# Patient Record
Sex: Male | Born: 1960 | Race: Black or African American | Hispanic: No | Marital: Married | Smoking: Never smoker
Health system: Southern US, Community
[De-identification: ages and names within clinical notes are randomized; demographics above are authoritative.]

## PROBLEM LIST (undated history)

## (undated) DIAGNOSIS — M1990 Unspecified osteoarthritis, unspecified site: Secondary | ICD-10-CM

## (undated) DIAGNOSIS — G5600 Carpal tunnel syndrome, unspecified upper limb: Secondary | ICD-10-CM

## (undated) DIAGNOSIS — E669 Obesity, unspecified: Secondary | ICD-10-CM

## (undated) DIAGNOSIS — R42 Dizziness and giddiness: Secondary | ICD-10-CM

## (undated) DIAGNOSIS — F419 Anxiety disorder, unspecified: Secondary | ICD-10-CM

## (undated) DIAGNOSIS — M519 Unspecified thoracic, thoracolumbar and lumbosacral intervertebral disc disorder: Secondary | ICD-10-CM

## (undated) DIAGNOSIS — K279 Peptic ulcer, site unspecified, unspecified as acute or chronic, without hemorrhage or perforation: Secondary | ICD-10-CM

## (undated) DIAGNOSIS — E7800 Pure hypercholesterolemia, unspecified: Secondary | ICD-10-CM

## (undated) DIAGNOSIS — F329 Major depressive disorder, single episode, unspecified: Secondary | ICD-10-CM

## (undated) DIAGNOSIS — I10 Essential (primary) hypertension: Secondary | ICD-10-CM

## (undated) HISTORY — DX: Essential (primary) hypertension: I10

## (undated) HISTORY — DX: Carpal tunnel syndrome, unspecified upper limb: G56.00

## (undated) HISTORY — DX: Unspecified thoracic, thoracolumbar and lumbosacral intervertebral disc disorder: M51.9

## (undated) HISTORY — DX: Obesity, unspecified: E66.9

## (undated) HISTORY — DX: Pure hypercholesterolemia, unspecified: E78.00

## (undated) HISTORY — DX: Peptic ulcer, site unspecified, unspecified as acute or chronic, without hemorrhage or perforation: K27.9

## (undated) HISTORY — DX: Major depressive disorder, single episode, unspecified: F32.9

## (undated) HISTORY — DX: Dizziness and giddiness: R42

## (undated) HISTORY — PX: HAND SURGERY: SHX662

---

## 2004-04-12 ENCOUNTER — Emergency Department (HOSPITAL_COMMUNITY): Admission: EM | Admit: 2004-04-12 | Discharge: 2004-04-12 | Payer: Self-pay

## 2004-07-09 ENCOUNTER — Emergency Department (HOSPITAL_COMMUNITY): Admission: EM | Admit: 2004-07-09 | Discharge: 2004-07-09 | Payer: Self-pay | Admitting: Emergency Medicine

## 2004-10-09 ENCOUNTER — Ambulatory Visit: Payer: Self-pay | Admitting: Orthopedic Surgery

## 2005-02-27 ENCOUNTER — Ambulatory Visit: Payer: Self-pay | Admitting: Family Medicine

## 2005-02-28 ENCOUNTER — Ambulatory Visit (HOSPITAL_COMMUNITY): Admission: RE | Admit: 2005-02-28 | Discharge: 2005-02-28 | Payer: Self-pay | Admitting: Family Medicine

## 2005-02-28 ENCOUNTER — Ambulatory Visit: Payer: Self-pay | Admitting: *Deleted

## 2005-03-06 ENCOUNTER — Ambulatory Visit: Payer: Self-pay | Admitting: Family Medicine

## 2005-03-26 ENCOUNTER — Ambulatory Visit: Payer: Self-pay | Admitting: Family Medicine

## 2005-04-24 ENCOUNTER — Ambulatory Visit: Payer: Self-pay | Admitting: Family Medicine

## 2005-05-21 ENCOUNTER — Ambulatory Visit: Payer: Self-pay | Admitting: Internal Medicine

## 2005-05-27 ENCOUNTER — Ambulatory Visit: Payer: Self-pay | Admitting: Family Medicine

## 2005-06-02 ENCOUNTER — Ambulatory Visit: Payer: Self-pay | Admitting: Orthopedic Surgery

## 2005-06-05 ENCOUNTER — Ambulatory Visit: Payer: Self-pay | Admitting: Family Medicine

## 2005-06-19 ENCOUNTER — Ambulatory Visit: Payer: Self-pay | Admitting: Family Medicine

## 2005-09-24 ENCOUNTER — Ambulatory Visit: Payer: Self-pay | Admitting: Family Medicine

## 2005-11-25 ENCOUNTER — Ambulatory Visit: Payer: Self-pay | Admitting: Family Medicine

## 2005-11-27 ENCOUNTER — Ambulatory Visit: Payer: Self-pay | Admitting: Orthopedic Surgery

## 2005-12-23 ENCOUNTER — Ambulatory Visit: Payer: Self-pay | Admitting: Family Medicine

## 2005-12-23 LAB — CONVERTED CEMR LAB
RBC count: 5.43 10*6/uL
WBC, blood: 5.5 10*3/uL

## 2005-12-24 ENCOUNTER — Encounter (INDEPENDENT_AMBULATORY_CARE_PROVIDER_SITE_OTHER): Payer: Self-pay | Admitting: Family Medicine

## 2005-12-29 ENCOUNTER — Ambulatory Visit: Payer: Self-pay | Admitting: Family Medicine

## 2006-02-16 ENCOUNTER — Ambulatory Visit: Payer: Self-pay | Admitting: Family Medicine

## 2006-04-15 ENCOUNTER — Ambulatory Visit: Payer: Self-pay | Admitting: Orthopedic Surgery

## 2006-04-15 ENCOUNTER — Emergency Department (HOSPITAL_COMMUNITY): Admission: EM | Admit: 2006-04-15 | Discharge: 2006-04-15 | Payer: Self-pay | Admitting: Emergency Medicine

## 2006-05-18 ENCOUNTER — Ambulatory Visit: Payer: Self-pay | Admitting: Family Medicine

## 2006-06-09 ENCOUNTER — Ambulatory Visit: Payer: Self-pay | Admitting: Orthopedic Surgery

## 2006-06-29 ENCOUNTER — Ambulatory Visit: Payer: Self-pay | Admitting: Family Medicine

## 2006-07-02 ENCOUNTER — Encounter: Payer: Self-pay | Admitting: Family Medicine

## 2006-07-04 DIAGNOSIS — M1990 Unspecified osteoarthritis, unspecified site: Secondary | ICD-10-CM | POA: Insufficient documentation

## 2006-07-04 DIAGNOSIS — I10 Essential (primary) hypertension: Secondary | ICD-10-CM | POA: Insufficient documentation

## 2006-07-04 DIAGNOSIS — K219 Gastro-esophageal reflux disease without esophagitis: Secondary | ICD-10-CM

## 2006-07-04 DIAGNOSIS — K649 Unspecified hemorrhoids: Secondary | ICD-10-CM | POA: Insufficient documentation

## 2006-07-04 DIAGNOSIS — N529 Male erectile dysfunction, unspecified: Secondary | ICD-10-CM

## 2006-07-04 DIAGNOSIS — J309 Allergic rhinitis, unspecified: Secondary | ICD-10-CM | POA: Insufficient documentation

## 2006-07-04 DIAGNOSIS — E785 Hyperlipidemia, unspecified: Secondary | ICD-10-CM

## 2006-07-07 ENCOUNTER — Ambulatory Visit (HOSPITAL_COMMUNITY): Admission: RE | Admit: 2006-07-07 | Discharge: 2006-07-07 | Payer: Self-pay | Admitting: Family Medicine

## 2006-07-27 ENCOUNTER — Ambulatory Visit: Payer: Self-pay | Admitting: Family Medicine

## 2006-09-05 ENCOUNTER — Emergency Department (HOSPITAL_COMMUNITY): Admission: EM | Admit: 2006-09-05 | Discharge: 2006-09-05 | Payer: Self-pay | Admitting: Emergency Medicine

## 2006-09-30 ENCOUNTER — Telehealth (INDEPENDENT_AMBULATORY_CARE_PROVIDER_SITE_OTHER): Payer: Self-pay | Admitting: Family Medicine

## 2006-10-10 ENCOUNTER — Encounter: Payer: Self-pay | Admitting: Orthopedic Surgery

## 2006-10-19 ENCOUNTER — Emergency Department (HOSPITAL_COMMUNITY): Admission: EM | Admit: 2006-10-19 | Discharge: 2006-10-19 | Payer: Self-pay | Admitting: Emergency Medicine

## 2006-10-23 ENCOUNTER — Emergency Department (HOSPITAL_COMMUNITY): Admission: EM | Admit: 2006-10-23 | Discharge: 2006-10-23 | Payer: Self-pay | Admitting: Emergency Medicine

## 2006-10-26 ENCOUNTER — Telehealth (INDEPENDENT_AMBULATORY_CARE_PROVIDER_SITE_OTHER): Payer: Self-pay | Admitting: Family Medicine

## 2006-11-09 ENCOUNTER — Ambulatory Visit: Payer: Self-pay | Admitting: Family Medicine

## 2006-11-09 LAB — CONVERTED CEMR LAB
HDL goal, serum: 40 mg/dL
LDL Goal: 130 mg/dL

## 2006-11-11 ENCOUNTER — Encounter (INDEPENDENT_AMBULATORY_CARE_PROVIDER_SITE_OTHER): Payer: Self-pay | Admitting: Family Medicine

## 2006-11-12 LAB — CONVERTED CEMR LAB
ALT: 23 units/L (ref 0–53)
AST: 23 units/L (ref 0–37)
Alkaline Phosphatase: 75 units/L (ref 39–117)
Chloride: 105 meq/L (ref 96–112)
Creatinine, Ser: 1.02 mg/dL (ref 0.40–1.50)
Total Bilirubin: 0.5 mg/dL (ref 0.3–1.2)

## 2006-11-23 ENCOUNTER — Telehealth (INDEPENDENT_AMBULATORY_CARE_PROVIDER_SITE_OTHER): Payer: Self-pay | Admitting: *Deleted

## 2006-11-25 ENCOUNTER — Ambulatory Visit: Payer: Self-pay | Admitting: Family Medicine

## 2006-11-26 ENCOUNTER — Encounter (INDEPENDENT_AMBULATORY_CARE_PROVIDER_SITE_OTHER): Payer: Self-pay | Admitting: Family Medicine

## 2006-11-26 ENCOUNTER — Ambulatory Visit: Payer: Self-pay | Admitting: Orthopedic Surgery

## 2006-11-30 ENCOUNTER — Encounter (INDEPENDENT_AMBULATORY_CARE_PROVIDER_SITE_OTHER): Payer: Self-pay | Admitting: Family Medicine

## 2006-12-24 ENCOUNTER — Encounter (INDEPENDENT_AMBULATORY_CARE_PROVIDER_SITE_OTHER): Payer: Self-pay | Admitting: Family Medicine

## 2006-12-29 ENCOUNTER — Encounter (INDEPENDENT_AMBULATORY_CARE_PROVIDER_SITE_OTHER): Payer: Self-pay | Admitting: Family Medicine

## 2007-01-11 ENCOUNTER — Ambulatory Visit: Payer: Self-pay | Admitting: Family Medicine

## 2007-01-11 DIAGNOSIS — F341 Dysthymic disorder: Secondary | ICD-10-CM | POA: Insufficient documentation

## 2007-01-12 ENCOUNTER — Encounter (INDEPENDENT_AMBULATORY_CARE_PROVIDER_SITE_OTHER): Payer: Self-pay | Admitting: Family Medicine

## 2007-01-12 LAB — CONVERTED CEMR LAB
ALT: 21 units/L (ref 0–53)
Alkaline Phosphatase: 72 units/L (ref 39–117)
Basophils Absolute: 0 10*3/uL (ref 0.0–0.1)
Creatinine, Ser: 1.06 mg/dL (ref 0.40–1.50)
Eosinophils Absolute: 0.2 10*3/uL (ref 0.0–0.7)
Eosinophils Relative: 3 % (ref 0–5)
Glucose, Bld: 108 mg/dL — ABNORMAL HIGH (ref 70–99)
HCT: 46.8 % (ref 39.0–52.0)
MCHC: 33.5 g/dL (ref 30.0–36.0)
MCV: 87.3 fL (ref 78.0–100.0)
PSA: 0.6 ng/mL (ref 0.10–4.00)
Platelets: 215 10*3/uL (ref 150–400)
RDW: 14.3 % — ABNORMAL HIGH (ref 11.5–14.0)
Sodium: 140 meq/L (ref 135–145)
Total Bilirubin: 0.6 mg/dL (ref 0.3–1.2)
Total Protein: 7.3 g/dL (ref 6.0–8.3)

## 2007-02-16 ENCOUNTER — Telehealth (INDEPENDENT_AMBULATORY_CARE_PROVIDER_SITE_OTHER): Payer: Self-pay | Admitting: *Deleted

## 2007-02-17 ENCOUNTER — Telehealth (INDEPENDENT_AMBULATORY_CARE_PROVIDER_SITE_OTHER): Payer: Self-pay | Admitting: *Deleted

## 2007-02-17 ENCOUNTER — Ambulatory Visit: Payer: Self-pay | Admitting: Family Medicine

## 2007-02-18 ENCOUNTER — Encounter (INDEPENDENT_AMBULATORY_CARE_PROVIDER_SITE_OTHER): Payer: Self-pay | Admitting: Family Medicine

## 2007-03-08 ENCOUNTER — Ambulatory Visit: Payer: Self-pay | Admitting: Internal Medicine

## 2007-03-17 ENCOUNTER — Encounter (INDEPENDENT_AMBULATORY_CARE_PROVIDER_SITE_OTHER): Payer: Self-pay | Admitting: Family Medicine

## 2007-03-29 ENCOUNTER — Encounter (INDEPENDENT_AMBULATORY_CARE_PROVIDER_SITE_OTHER): Payer: Self-pay | Admitting: Family Medicine

## 2007-04-19 ENCOUNTER — Telehealth (INDEPENDENT_AMBULATORY_CARE_PROVIDER_SITE_OTHER): Payer: Self-pay | Admitting: Family Medicine

## 2007-04-19 ENCOUNTER — Ambulatory Visit: Payer: Self-pay | Admitting: Family Medicine

## 2007-04-20 ENCOUNTER — Encounter (INDEPENDENT_AMBULATORY_CARE_PROVIDER_SITE_OTHER): Payer: Self-pay | Admitting: Family Medicine

## 2007-04-21 LAB — CONVERTED CEMR LAB
CO2: 28 meq/L (ref 19–32)
Chloride: 99 meq/L (ref 96–112)
Creatinine, Ser: 1.01 mg/dL (ref 0.40–1.50)
Potassium: 3.3 meq/L — ABNORMAL LOW (ref 3.5–5.3)

## 2007-04-26 ENCOUNTER — Telehealth (INDEPENDENT_AMBULATORY_CARE_PROVIDER_SITE_OTHER): Payer: Self-pay | Admitting: *Deleted

## 2007-04-26 ENCOUNTER — Encounter (INDEPENDENT_AMBULATORY_CARE_PROVIDER_SITE_OTHER): Payer: Self-pay | Admitting: Family Medicine

## 2007-04-27 ENCOUNTER — Ambulatory Visit: Payer: Self-pay | Admitting: Family Medicine

## 2007-04-27 DIAGNOSIS — G5600 Carpal tunnel syndrome, unspecified upper limb: Secondary | ICD-10-CM | POA: Insufficient documentation

## 2007-04-29 ENCOUNTER — Encounter (INDEPENDENT_AMBULATORY_CARE_PROVIDER_SITE_OTHER): Payer: Self-pay | Admitting: Family Medicine

## 2007-04-29 ENCOUNTER — Ambulatory Visit: Payer: Self-pay | Admitting: Orthopedic Surgery

## 2007-05-18 ENCOUNTER — Ambulatory Visit: Payer: Self-pay | Admitting: Family Medicine

## 2007-05-24 ENCOUNTER — Encounter (INDEPENDENT_AMBULATORY_CARE_PROVIDER_SITE_OTHER): Payer: Self-pay | Admitting: Family Medicine

## 2007-05-31 ENCOUNTER — Encounter (INDEPENDENT_AMBULATORY_CARE_PROVIDER_SITE_OTHER): Payer: Self-pay | Admitting: Family Medicine

## 2007-06-01 ENCOUNTER — Telehealth (INDEPENDENT_AMBULATORY_CARE_PROVIDER_SITE_OTHER): Payer: Self-pay | Admitting: *Deleted

## 2007-06-01 ENCOUNTER — Encounter (INDEPENDENT_AMBULATORY_CARE_PROVIDER_SITE_OTHER): Payer: Self-pay | Admitting: Family Medicine

## 2007-06-01 LAB — CONVERTED CEMR LAB
AST: 19 units/L (ref 0–37)
Albumin: 4.4 g/dL (ref 3.5–5.2)
BUN: 12 mg/dL (ref 6–23)
Calcium: 9.7 mg/dL (ref 8.4–10.5)
Chloride: 99 meq/L (ref 96–112)
Cholesterol: 223 mg/dL — ABNORMAL HIGH (ref 0–200)
Creatinine, Ser: 1 mg/dL (ref 0.40–1.50)
Glucose, Bld: 116 mg/dL — ABNORMAL HIGH (ref 70–99)
HDL: 43 mg/dL (ref >39)
Total CHOL/HDL Ratio: 5.2
Triglycerides: 118 mg/dL (ref <150)

## 2007-06-02 ENCOUNTER — Encounter (INDEPENDENT_AMBULATORY_CARE_PROVIDER_SITE_OTHER): Payer: Self-pay | Admitting: Family Medicine

## 2007-06-25 ENCOUNTER — Telehealth (INDEPENDENT_AMBULATORY_CARE_PROVIDER_SITE_OTHER): Payer: Self-pay | Admitting: *Deleted

## 2007-06-25 ENCOUNTER — Ambulatory Visit: Payer: Self-pay | Admitting: Family Medicine

## 2007-06-25 DIAGNOSIS — R7309 Other abnormal glucose: Secondary | ICD-10-CM | POA: Insufficient documentation

## 2007-06-25 DIAGNOSIS — M5126 Other intervertebral disc displacement, lumbar region: Secondary | ICD-10-CM

## 2007-07-22 ENCOUNTER — Encounter: Payer: Self-pay | Admitting: Family Medicine

## 2007-07-28 ENCOUNTER — Ambulatory Visit: Payer: Self-pay | Admitting: Orthopedic Surgery

## 2007-08-06 ENCOUNTER — Ambulatory Visit: Payer: Self-pay | Admitting: Family Medicine

## 2007-08-06 ENCOUNTER — Telehealth (INDEPENDENT_AMBULATORY_CARE_PROVIDER_SITE_OTHER): Payer: Self-pay | Admitting: *Deleted

## 2007-08-19 ENCOUNTER — Telehealth (INDEPENDENT_AMBULATORY_CARE_PROVIDER_SITE_OTHER): Payer: Self-pay | Admitting: *Deleted

## 2007-08-23 ENCOUNTER — Ambulatory Visit: Payer: Self-pay | Admitting: Family Medicine

## 2007-08-24 ENCOUNTER — Encounter (INDEPENDENT_AMBULATORY_CARE_PROVIDER_SITE_OTHER): Payer: Self-pay | Admitting: Family Medicine

## 2007-09-10 ENCOUNTER — Ambulatory Visit: Payer: Self-pay | Admitting: Family Medicine

## 2007-09-10 ENCOUNTER — Telehealth (INDEPENDENT_AMBULATORY_CARE_PROVIDER_SITE_OTHER): Payer: Self-pay | Admitting: *Deleted

## 2007-09-11 LAB — CONVERTED CEMR LAB
Alkaline Phosphatase: 67 units/L (ref 39–117)
BUN: 11 mg/dL (ref 6–23)
CO2: 28 meq/L (ref 19–32)
Cholesterol: 227 mg/dL — ABNORMAL HIGH (ref 0–200)
Creatinine, Ser: 1 mg/dL (ref 0.40–1.50)
Glucose, Bld: 99 mg/dL (ref 70–99)
HDL: 37 mg/dL — ABNORMAL LOW (ref >39)
LDL Cholesterol: 159 mg/dL — ABNORMAL HIGH (ref 0–99)
Sodium: 143 meq/L (ref 135–145)
Total Bilirubin: 0.5 mg/dL (ref 0.3–1.2)
Total CHOL/HDL Ratio: 6.1
Triglycerides: 153 mg/dL — ABNORMAL HIGH (ref <150)
VLDL: 31 mg/dL (ref 0–40)

## 2007-09-14 ENCOUNTER — Telehealth (INDEPENDENT_AMBULATORY_CARE_PROVIDER_SITE_OTHER): Payer: Self-pay | Admitting: *Deleted

## 2007-10-05 ENCOUNTER — Encounter (INDEPENDENT_AMBULATORY_CARE_PROVIDER_SITE_OTHER): Payer: Self-pay | Admitting: Family Medicine

## 2007-10-07 ENCOUNTER — Encounter (INDEPENDENT_AMBULATORY_CARE_PROVIDER_SITE_OTHER): Payer: Self-pay | Admitting: Family Medicine

## 2007-10-12 ENCOUNTER — Encounter (INDEPENDENT_AMBULATORY_CARE_PROVIDER_SITE_OTHER): Payer: Self-pay | Admitting: Family Medicine

## 2007-10-13 ENCOUNTER — Telehealth (INDEPENDENT_AMBULATORY_CARE_PROVIDER_SITE_OTHER): Payer: Self-pay | Admitting: *Deleted

## 2007-10-22 ENCOUNTER — Ambulatory Visit: Payer: Self-pay | Admitting: Family Medicine

## 2007-10-23 ENCOUNTER — Encounter (INDEPENDENT_AMBULATORY_CARE_PROVIDER_SITE_OTHER): Payer: Self-pay | Admitting: Family Medicine

## 2007-10-25 LAB — CONVERTED CEMR LAB
ALT: 23 units/L (ref 0–53)
AST: 22 units/L (ref 0–37)
Albumin: 4.1 g/dL (ref 3.5–5.2)
Alkaline Phosphatase: 72 units/L (ref 39–117)
Total Bilirubin: 0.3 mg/dL (ref 0.3–1.2)

## 2007-10-26 ENCOUNTER — Telehealth (INDEPENDENT_AMBULATORY_CARE_PROVIDER_SITE_OTHER): Payer: Self-pay | Admitting: *Deleted

## 2007-10-27 ENCOUNTER — Telehealth: Payer: Self-pay | Admitting: Orthopedic Surgery

## 2007-10-27 ENCOUNTER — Telehealth (INDEPENDENT_AMBULATORY_CARE_PROVIDER_SITE_OTHER): Payer: Self-pay | Admitting: Family Medicine

## 2007-12-02 ENCOUNTER — Ambulatory Visit: Payer: Self-pay | Admitting: Orthopedic Surgery

## 2007-12-24 ENCOUNTER — Ambulatory Visit: Payer: Self-pay | Admitting: Family Medicine

## 2007-12-24 LAB — CONVERTED CEMR LAB
Bilirubin Urine: NEGATIVE
Blood in Urine, dipstick: NEGATIVE
Protein, U semiquant: NEGATIVE
Urobilinogen, UA: 0.2
WBC Urine, dipstick: NEGATIVE
pH: 5.5

## 2007-12-29 LAB — CONVERTED CEMR LAB
BUN: 10 mg/dL (ref 6–23)
CO2: 30 meq/L (ref 19–32)
Calcium: 9.6 mg/dL (ref 8.4–10.5)
Chloride: 101 meq/L (ref 96–112)
Cholesterol: 183 mg/dL (ref 0–200)
Creatinine, Ser: 0.97 mg/dL (ref 0.40–1.50)
HDL: 38 mg/dL — ABNORMAL LOW (ref >39)
Total Bilirubin: 0.6 mg/dL (ref 0.3–1.2)
Total CHOL/HDL Ratio: 4.8
VLDL: 32 mg/dL (ref 0–40)

## 2008-01-06 ENCOUNTER — Ambulatory Visit: Payer: Self-pay | Admitting: Family Medicine

## 2008-01-10 ENCOUNTER — Telehealth (INDEPENDENT_AMBULATORY_CARE_PROVIDER_SITE_OTHER): Payer: Self-pay | Admitting: Family Medicine

## 2008-02-01 ENCOUNTER — Encounter (INDEPENDENT_AMBULATORY_CARE_PROVIDER_SITE_OTHER): Payer: Self-pay | Admitting: Family Medicine

## 2008-02-04 ENCOUNTER — Encounter (INDEPENDENT_AMBULATORY_CARE_PROVIDER_SITE_OTHER): Payer: Self-pay | Admitting: Family Medicine

## 2008-02-10 ENCOUNTER — Ambulatory Visit: Payer: Self-pay | Admitting: Family Medicine

## 2008-02-14 ENCOUNTER — Telehealth: Payer: Self-pay | Admitting: Orthopedic Surgery

## 2008-03-23 ENCOUNTER — Ambulatory Visit: Payer: Self-pay | Admitting: Family Medicine

## 2008-04-06 ENCOUNTER — Encounter (INDEPENDENT_AMBULATORY_CARE_PROVIDER_SITE_OTHER): Payer: Self-pay | Admitting: Family Medicine

## 2008-04-17 ENCOUNTER — Telehealth (INDEPENDENT_AMBULATORY_CARE_PROVIDER_SITE_OTHER): Payer: Self-pay | Admitting: *Deleted

## 2008-04-18 ENCOUNTER — Ambulatory Visit: Payer: Self-pay | Admitting: Family Medicine

## 2008-04-24 ENCOUNTER — Ambulatory Visit: Payer: Self-pay | Admitting: Orthopedic Surgery

## 2008-05-18 ENCOUNTER — Ambulatory Visit: Payer: Self-pay | Admitting: Family Medicine

## 2008-05-30 ENCOUNTER — Telehealth: Payer: Self-pay | Admitting: Orthopedic Surgery

## 2008-06-02 ENCOUNTER — Encounter (INDEPENDENT_AMBULATORY_CARE_PROVIDER_SITE_OTHER): Payer: Self-pay | Admitting: Family Medicine

## 2008-06-02 ENCOUNTER — Telehealth (INDEPENDENT_AMBULATORY_CARE_PROVIDER_SITE_OTHER): Payer: Self-pay | Admitting: Family Medicine

## 2008-06-26 ENCOUNTER — Telehealth: Payer: Self-pay | Admitting: Orthopedic Surgery

## 2008-07-10 ENCOUNTER — Telehealth (INDEPENDENT_AMBULATORY_CARE_PROVIDER_SITE_OTHER): Payer: Self-pay | Admitting: *Deleted

## 2008-08-01 ENCOUNTER — Telehealth (INDEPENDENT_AMBULATORY_CARE_PROVIDER_SITE_OTHER): Payer: Self-pay | Admitting: Family Medicine

## 2008-08-15 ENCOUNTER — Encounter (INDEPENDENT_AMBULATORY_CARE_PROVIDER_SITE_OTHER): Payer: Self-pay | Admitting: Family Medicine

## 2008-08-17 ENCOUNTER — Ambulatory Visit: Payer: Self-pay | Admitting: Family Medicine

## 2008-08-18 ENCOUNTER — Encounter (INDEPENDENT_AMBULATORY_CARE_PROVIDER_SITE_OTHER): Payer: Self-pay | Admitting: Family Medicine

## 2008-08-23 ENCOUNTER — Encounter (INDEPENDENT_AMBULATORY_CARE_PROVIDER_SITE_OTHER): Payer: Self-pay | Admitting: *Deleted

## 2008-08-23 LAB — CONVERTED CEMR LAB
ALT: 14 units/L (ref 0–53)
AST: 18 units/L (ref 0–37)
Alkaline Phosphatase: 64 units/L (ref 39–117)
Basophils Absolute: 0.1 10*3/uL (ref 0.0–0.1)
Basophils Relative: 1 % (ref 0–1)
Eosinophils Absolute: 0.3 10*3/uL (ref 0.0–0.7)
Eosinophils Relative: 6 % — ABNORMAL HIGH (ref 0–5)
Glucose, Bld: 95 mg/dL (ref 70–99)
HCT: 45.7 % (ref 39.0–52.0)
MCHC: 32.4 g/dL (ref 30.0–36.0)
MCV: 85.3 fL (ref 78.0–100.0)
Neutrophils Relative %: 46 % (ref 43–77)
PSA: 0.49 ng/mL (ref 0.10–4.00)
Platelets: 205 10*3/uL (ref 150–400)
Potassium: 3.7 meq/L (ref 3.5–5.3)
RDW: 13.8 % (ref 11.5–15.5)
Sodium: 140 meq/L (ref 135–145)
Total Bilirubin: 0.6 mg/dL (ref 0.3–1.2)
Total Protein: 7 g/dL (ref 6.0–8.3)

## 2008-09-19 ENCOUNTER — Telehealth: Payer: Self-pay | Admitting: Orthopedic Surgery

## 2008-09-25 ENCOUNTER — Ambulatory Visit: Payer: Self-pay | Admitting: Family Medicine

## 2008-10-04 ENCOUNTER — Telehealth: Payer: Self-pay | Admitting: Orthopedic Surgery

## 2008-11-29 ENCOUNTER — Ambulatory Visit: Payer: Self-pay | Admitting: Family Medicine

## 2008-11-29 DIAGNOSIS — R0989 Other specified symptoms and signs involving the circulatory and respiratory systems: Secondary | ICD-10-CM

## 2008-11-29 DIAGNOSIS — R0609 Other forms of dyspnea: Secondary | ICD-10-CM | POA: Insufficient documentation

## 2008-11-30 ENCOUNTER — Encounter (INDEPENDENT_AMBULATORY_CARE_PROVIDER_SITE_OTHER): Payer: Self-pay | Admitting: Family Medicine

## 2008-12-01 ENCOUNTER — Ambulatory Visit: Payer: Self-pay | Admitting: Cardiology

## 2008-12-01 ENCOUNTER — Encounter (INDEPENDENT_AMBULATORY_CARE_PROVIDER_SITE_OTHER): Payer: Self-pay | Admitting: Family Medicine

## 2008-12-01 ENCOUNTER — Ambulatory Visit (HOSPITAL_COMMUNITY): Admission: RE | Admit: 2008-12-01 | Discharge: 2008-12-01 | Payer: Self-pay | Admitting: Family Medicine

## 2008-12-05 ENCOUNTER — Encounter (INDEPENDENT_AMBULATORY_CARE_PROVIDER_SITE_OTHER): Payer: Self-pay | Admitting: Family Medicine

## 2008-12-20 ENCOUNTER — Telehealth (INDEPENDENT_AMBULATORY_CARE_PROVIDER_SITE_OTHER): Payer: Self-pay | Admitting: *Deleted

## 2009-01-31 ENCOUNTER — Ambulatory Visit: Payer: Self-pay | Admitting: Family Medicine

## 2009-02-13 ENCOUNTER — Encounter (INDEPENDENT_AMBULATORY_CARE_PROVIDER_SITE_OTHER): Payer: Self-pay | Admitting: Family Medicine

## 2009-02-14 LAB — CONVERTED CEMR LAB
ALT: 12 units/L (ref 0–53)
CO2: 28 meq/L (ref 19–32)
Calcium: 9.4 mg/dL (ref 8.4–10.5)
Chloride: 104 meq/L (ref 96–112)
Cholesterol: 174 mg/dL (ref 0–200)
Creatinine, Ser: 0.98 mg/dL (ref 0.40–1.50)
Glucose, Bld: 91 mg/dL (ref 70–99)
Total CHOL/HDL Ratio: 4.5
Triglycerides: 133 mg/dL (ref <150)

## 2009-03-07 ENCOUNTER — Encounter (INDEPENDENT_AMBULATORY_CARE_PROVIDER_SITE_OTHER): Payer: Self-pay | Admitting: *Deleted

## 2009-03-08 ENCOUNTER — Ambulatory Visit: Payer: Self-pay | Admitting: Family Medicine

## 2009-03-16 ENCOUNTER — Encounter (INDEPENDENT_AMBULATORY_CARE_PROVIDER_SITE_OTHER): Payer: Self-pay | Admitting: Family Medicine

## 2009-04-02 ENCOUNTER — Ambulatory Visit: Payer: Self-pay | Admitting: Family Medicine

## 2009-04-02 DIAGNOSIS — J069 Acute upper respiratory infection, unspecified: Secondary | ICD-10-CM | POA: Insufficient documentation

## 2009-09-18 ENCOUNTER — Ambulatory Visit: Payer: Self-pay | Admitting: Orthopedic Surgery

## 2010-02-20 ENCOUNTER — Ambulatory Visit: Payer: Self-pay | Admitting: Orthopedic Surgery

## 2010-02-20 DIAGNOSIS — M1710 Unilateral primary osteoarthritis, unspecified knee: Secondary | ICD-10-CM | POA: Insufficient documentation

## 2010-02-21 ENCOUNTER — Encounter (INDEPENDENT_AMBULATORY_CARE_PROVIDER_SITE_OTHER): Payer: Self-pay | Admitting: *Deleted

## 2010-03-26 ENCOUNTER — Telehealth: Payer: Self-pay | Admitting: Orthopedic Surgery

## 2010-05-14 ENCOUNTER — Emergency Department (HOSPITAL_COMMUNITY)
Admission: EM | Admit: 2010-05-14 | Discharge: 2010-05-15 | Payer: Self-pay | Source: Home / Self Care | Admitting: Emergency Medicine

## 2010-06-05 ENCOUNTER — Telehealth: Payer: Self-pay | Admitting: Orthopedic Surgery

## 2010-07-12 ENCOUNTER — Emergency Department (HOSPITAL_COMMUNITY)
Admission: EM | Admit: 2010-07-12 | Discharge: 2010-07-12 | Payer: Self-pay | Source: Home / Self Care | Admitting: Emergency Medicine

## 2010-08-11 ENCOUNTER — Encounter: Payer: Self-pay | Admitting: Family Medicine

## 2010-08-20 NOTE — Letter (Signed)
Summary: Historic Patient File  Historic Patient File   Imported By: Lind Guest 04/22/2010 08:36:17  _____________________________________________________________________  External Attachment:    Type:   Image     Comment:   External Document

## 2010-08-20 NOTE — Miscellaneous (Signed)
Summary: Pre-auth information for in-patient surgery  Clinical Lists Changes  Call to insurer [employer] [facility], regarding In-patient surgery scheduled at [facility] 04/08/10, CPT [id number]; spoke w/Stephanie,intake specialist.  Pre-Auth Ref # [id number]. If clinicals are needed, we will be notified by nurse case manager.

## 2010-08-20 NOTE — Letter (Signed)
Summary: surgery order LT total knee  surgery order LT total knee   Imported By: Cammie Sickle 02/21/2010 17:54:11  _____________________________________________________________________  External Attachment:    Type:   Image     Comment:   External Document

## 2010-08-20 NOTE — Progress Notes (Signed)
Summary: needs to hold on Total knee surgery  Phone Note Call from Patient   Caller: Patient Summary of Call: Patient called to relay that he needs to postpone his total knee surgery due to wife's new job and no one to be with him; states may need to postpone till first of new year.  Please call patient, has questions Home ph is CHANGED to [phone]; alternate ph is [phone] Initial call taken by: Cammie Sickle,  March 26, 2010 2:46 PM  Follow-up for Phone Call        ok I will cx Follow-up by: Ether Griffins,  March 26, 2010 3:04 PM

## 2010-08-20 NOTE — Progress Notes (Signed)
Summary: norco 5 filled instead of 10's  Phone Note Call from Patient Call back at [facility] Phone [phone]   Summary of Call: wants norco 10 refilled, he cx his total knee surgery, ok to fill or not Initial call taken by: Ether Griffins,  June 05, 2010 3:33 PM  Follow-up for Phone Call        no 10  \par 5 mg ok  Follow-up by: Fuller Canada MD,  June 05, 2010 3:43 PM    Prescriptions: NORCO 5-325 MG TABS (HYDROCODONE-ACETAMINOPHEN) 1 q 6 as needed pain  #84 x 1   Entered by:   Ether Griffins   Authorized by:   Fuller Canada MD   Signed by:   Ether Griffins on 06/05/2010   Method used:   Historical   RxID:   [id number]

## 2010-08-20 NOTE — Assessment & Plan Note (Signed)
Summary: LT KNEE PAIN/NEEDS XRAY/SEC HORIZ(?TRADIT MEDICARE)/CAF   Visit Type:  Follow-up Referring Provider:  self Primary Provider:  Dr. Sudie Bailey  CC:  left knee pain.  History of Present Illness: This is a 50 year old male who I have followed for several years now for LEFT knee pain.  He's having increasing pain continued stiffness with catching and locking and some swelling in the LEFT knee.  We have advised weight loss exercise, anti-inflammatories and analgesics for pain including hydrocodone which has not been able to control his pain  His radiographs show significant arthritis of the knee.  He is limited options at this point and has been advised to have a knee replacement vs. continued nonoperative treatment.  We do not think arthroscopy would help  He has agreed to total knee replacement  He understands that at his age of 26 he will probably need at least one revision.  He understands the risk of this procedure which include but are not limited to bleeding, infection, pulmonary embolus, death, blood clot, infection which would require revision which cannulate the amputation if the infection cannot be cleared.  He understands that he will need to stay in the rehabilitation process or risk having a stiff knee.  He understands that he should expect 90-95% pain relief.   Meds: Citalopram, Omeprazole, Fexofenadine, Diovan/HCTZ, Norco 5 for pain helps some, Lasix, Pro air inhaler, Pravastatin, Neurontin, Potassium, Nasal spray,   Allergies: No Known Drug Allergies  Past History:  Past Medical History: Last updated: 12/24/2007 Current Problems:  EPIGASTRIC PAIN (ICD-789.06) JOINT EFFUSION, KNEE (ICD-719.06) KNEE PAIN (ICD-719.46) SCIATICA (ICD-724.3) BACK PAIN (ICD-724.5) FASTING HYPERGLYCEMIA (ICD-790.29) DEGENERATIVE DISC DISEASE, LUMBOSACRAL SPINE W/RADICULOPATHY (ICD-722.10) CARPAL TUNNEL SYNDROME, BILATERAL (ICD-354.0) MALAISE AND FATIGUE  (ICD-780.79) DEPRESSION/ANXIETY (ICD-300.4) HEMORRHOIDS (ICD-455.6) MORBID OBESITY (ICD-278.01) IMPOTENCE, 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) OSTEOARTHRITIS (ICD-715.90) HYPERTENSION (ICD-401.9) HYPERLIPIDEMIA (ICD-272.4) GERD (ICD-530.81) ALLERGIC RHINITIS (ICD-477.9)  Past Surgical History: Last updated: 11/09/2006 SURGERY ON LEFT HAND DUE TO TRAUMA  Family History: Last updated: Oct 01, 2009 Father: died from pneumonia 96 Mom: 31 DM and Nerves, Alzheimers 3 sisters: anxiety; ulcers; HTN; cysts- 75, 48 and 45 3 brothers: 2 deceased MVA - ages 58 for both; 1 A&W  - 49 GERD and HTN 4 total - boys times 2  age 24 Hx of HTN and 20  allergies - and 2 girls age 30 and 92 - healthy Family History of Diabetes Family History of Arthritis  Social History: Last updated: 11/29/2008 Married Never Smoked Alcohol use-no Drug use-no 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  Physical Exam  Additional Exam:  GEN: well developed, well nourished, normal grooming and hygiene, no deformity and endomorphicl body habitus.   CDV: pulses are normal, no edema, no erythema. no tenderness  Lymph: normal lymph nodes   Skin: no rashes, skin lesions or open sores   NEURO: normal coordination, reflexes, sensation.   Psyche: awake, alert and oriented. Mood normal   Gait: heel-to-toe gait with decreased stride length and decreased feet of gait  His upper extremities show no contracture subluxation atrophy or tremor  His RIGHT knee is in varus.  Mild medial joint line tenderness.  Flexion 120.  Motor exam normal.  Knee stable.  LEFT knee severe medial joint line  tenderness, varus alignment  His  flexion arc is 120.  He has a slight flexion contracture.  His motor exam is normal  His knee is stable.       Impression & Recommendations:  Problem # 1:  KNEE, ARTHRITIS, DEGEN./OSTEO (ICD-715.96) Assessment Deteriorated the most recent x-rays show that he has bone to bone changes on the medial side with mild to moderate varus deformity and mild to moderate osteophytes  We have recommended a LEFT total knee arthroplasty.  Medications Added to Medication List This Visit: 1)  Norco 10-325 Mg Tabs (Hydrocodone-acetaminophen) .Marland Kitchen.. 1 by mouth q 4 as needed pain  Other Orders: Est. Patient Level IV (78242)  Patient Instructions: 1)  Left Total Knee replacement  2)  Informed consent process: I have discussed the procedure with the patient. I have answered their questions. The risks of bleeding, infection, nerve and vascualr injury have been discussed. The diagnosis and reason for surgery have been explained. The patient demonstrates understanding of this discussion. Specific to this procedure risks include:  3)  Stiffness 4)  Pain 5)  Infection-can lead to further surgery and amputation 6)  DOS 04/08/10 7)  preop 04/04/10 11:00am take packet to [facility] [facility] stay [facility] 8)  Post op 1 October 4th in our office Prescriptions: NORCO 10-325 MG TABS (HYDROCODONE-ACETAMINOPHEN) 1 by mouth q 4 as needed pain  #84 x 2   Entered and Authorized by:   Fuller Canada MD   Signed by:   Fuller Canada MD on 02/20/2010   Method used:   Print then Give to Patient   RxID:   [id number]

## 2010-08-20 NOTE — Letter (Signed)
Summary: History form  History form   Imported By: Jacklynn Ganong 09/24/2009 10:38:25  _____________________________________________________________________  External Attachment:    Type:   Image     Comment:   External Document

## 2010-08-20 NOTE — Assessment & Plan Note (Signed)
Summary: rt hand pain  needs xr/medicare/bsf   Vital Signs:  Patient profile:   50 year old male Pulse rate:   82 / minute Resp:     16 per minute  Vitals Entered By: Fuller Canada MD (September 18, 2009 3:02 PM)  Visit Type:  new problem Referring Provider:  self Primary Provider:  Dr. Sudie Bailey  CC:  right hand pain.  History of Present Illness: 50 year old male with pain in his RIGHT hand for 2 weeks no injury complaints of numbness in his fingers and has a history of locking in his RIGHT thumb with decreasing grip strength.  He's had I nerve conduction study with carpal tunnel syndrome diagnosed.  He had a brace he lost it.  He is relieved with heat worse with cold says his pain is 9/10    Will have xrays today in our office.  Meds: Nexium, Proventil, Diovan HCT, Fexofenadine, Flonase, Celexa, Pravachol.      Problems Prior to Update: 1)  Family History of Arthritis  (ICD-V17.7) 2)  Family History of Diabetes  (ICD-V18.0) 3)  ? 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  Past Medical History: Reviewed history from 12/24/2007 and no changes required. 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 SURGERY ON LEFT HAND DUE TO TRAUMA  Family History: Reviewed history from 11/29/2008 and no changes required. Father: died from pneumonia 58 Mom: 54 DM and Nerves, Alzheimers 3 sisters: anxiety; ulcers; HTN; cysts- 4, 48 and 45 3 brothers: 2 deceased MVA - ages 61 for both; 1 A&W  - 65 GERD and HTN 4 total - boys times 2  age 7 Hx of HTN and 20  allergies - and 2 girls age 52 and 18 - healthy Family History of Diabetes Family History of Arthritis  Social History: Reviewed history from 11/29/2008 and no changes required. 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 Respiratory:  Complains of short of breath; denies difficulty breathing, COPD, cough, and pneumonia; wheezing. Gastrointestinal:  Denies nausea, vomiting, diarrhea, constipation, difficulty swallowing, ulcers, GERD, and reflux. Genitourinary:  Denies kidney failure, kidney transplant, kidney stones, burning, poor stream, testicular cancer, blood in urine, and . Neurologic:  Denies headache, dizziness,  migraines, numbness, weakness, tremor, and unsteady  walking. Musculoskeletal:  Complains of joint pain and joint swelling; denies rheumatoid arthritis, gout, bone cancer, osteoporosis, and ; stiffness and muscle pain. Endocrine:  Denies thyroid disease, goiter, and diabetes. Psychiatric:  Complains of depression; denies mood swings, anxiety, panic attack, bipolar, and schizophrenia. Skin:  Denies eczema, cancer, and itching. HEENT:  Denies poor vision, cataracts, glaucoma, poor hearing, vertigo, ears ringing, sinusitis, hoarseness, toothaches, and bleeding gums; redness and watering of eyes. Immunology:  Complains of seasonal allergies; denies sinus problems and allergic to bee stings. Hemoatologic:  Denies lymph node cancer and lymph edema.  The review of systems is negative for Constitutional.  Physical Exam  Additional Exam:   VS reviewed and were normal  GEN: appearance was normal he is a heavyset male  CDV: normal pulses temperature and no edema  LYMPH nodes were normal   SKIN was normal   Neuro: normal reflexes I could not detect sensory loss but he had tenderness over his carpal tunnel. Psyche: AAO x 3 and mood was normal   MSK *Gait was normal  inspection RIGHT hand and wrist shows tenderness over carpal tunnel, decreased range of motion in the small joints of the hand, decreased grip strength RIGHT compared to LEFT, wrist joint stable      Impression & Recommendations:  Problem # 1:  CARPAL TUNNEL SYNDROME (ICD-354.0) Assessment New  100 mg Neurontin 2 tablets at night #90  Wrist splint  Follow up as needed  Orders: Est. Patient Level IV (16109)  Medications Added to Medication List This Visit: 1)  Norco 5-325 Mg Tabs (Hydrocodone-acetaminophen) .Marland Kitchen.. 1 q 6 as needed pain 2)  Neurontin 100 Mg Caps (Gabapentin) .... 2 tablets every night Prescriptions: NEURONTIN 100 MG CAPS (GABAPENTIN) 2 tablets every night  #90 x 0   Entered and Authorized by:   Fuller Canada MD   Signed by:   Fuller Canada MD on  09/19/2009   Method used:   Handwritten   RxID:   [id number] NORCO 5-325 MG TABS (HYDROCODONE-ACETAMINOPHEN) 1 q 6 as needed pain  #60 x 2   Entered and Authorized by:   Fuller Canada MD   Signed by:   Fuller Canada MD on 09/18/2009   Method used:   Print then Give to Patient   RxID:   [id number]

## 2010-10-17 ENCOUNTER — Other Ambulatory Visit: Payer: Self-pay | Admitting: *Deleted

## 2010-10-17 DIAGNOSIS — M25569 Pain in unspecified knee: Secondary | ICD-10-CM

## 2010-10-17 MED ORDER — HYDROCODONE-ACETAMINOPHEN 5-325 MG PO TABS: 1.0000 | ORAL_TABLET | Freq: Four times a day (QID) | ORAL | Status: AC | PRN

## 2010-11-26 ENCOUNTER — Encounter: Payer: Self-pay | Admitting: Orthopedic Surgery

## 2010-11-26 ENCOUNTER — Ambulatory Visit: Payer: Self-pay | Admitting: Orthopedic Surgery

## 2010-12-02 ENCOUNTER — Emergency Department (HOSPITAL_COMMUNITY)
Admission: EM | Admit: 2010-12-02 | Discharge: 2010-12-02 | Disposition: A | Discharge status: Home / Self Care | Payer: MEDICARE | Attending: Emergency Medicine | Admitting: Emergency Medicine

## 2010-12-02 ENCOUNTER — Emergency Department (HOSPITAL_COMMUNITY): Payer: MEDICARE

## 2010-12-02 DIAGNOSIS — J3489 Other specified disorders of nose and nasal sinuses: Secondary | ICD-10-CM | POA: Insufficient documentation

## 2010-12-02 DIAGNOSIS — H9209 Otalgia, unspecified ear: Secondary | ICD-10-CM | POA: Insufficient documentation

## 2010-12-02 DIAGNOSIS — R059 Cough, unspecified: Secondary | ICD-10-CM | POA: Insufficient documentation

## 2010-12-02 DIAGNOSIS — J069 Acute upper respiratory infection, unspecified: Secondary | ICD-10-CM | POA: Insufficient documentation

## 2010-12-02 DIAGNOSIS — R070 Pain in throat: Secondary | ICD-10-CM | POA: Insufficient documentation

## 2010-12-02 DIAGNOSIS — R05 Cough: Secondary | ICD-10-CM | POA: Insufficient documentation

## 2010-12-02 DIAGNOSIS — F329 Major depressive disorder, single episode, unspecified: Secondary | ICD-10-CM | POA: Insufficient documentation

## 2010-12-02 DIAGNOSIS — Z9889 Other specified postprocedural states: Secondary | ICD-10-CM | POA: Insufficient documentation

## 2010-12-02 DIAGNOSIS — Z79899 Other long term (current) drug therapy: Secondary | ICD-10-CM | POA: Insufficient documentation

## 2010-12-02 DIAGNOSIS — F3289 Other specified depressive episodes: Secondary | ICD-10-CM | POA: Insufficient documentation

## 2010-12-02 DIAGNOSIS — I10 Essential (primary) hypertension: Secondary | ICD-10-CM | POA: Insufficient documentation

## 2010-12-03 NOTE — Consult Note (Signed)
NAME:  Parker Rodriguez, Parker Rodriguez              ACCOUNT NO.:  [id number]   MEDICAL RECORD NO.:  [id number]          PATIENT TYPE:  AMB   LOCATION:  DAY                           FACILITY:  [facility]   PHYSICIAN:  R. Roetta Sessions, M.D. DATE OF BIRTH:  Oct 03, 1960   DATE OF CONSULTATION:  DATE OF DISCHARGE:                                 CONSULTATION   REASON FOR CONSULTATION:  Refractory heartburn and  indigestion/hematochezia.   HISTORY OF PRESENT ILLNESS:  Parker Rodriguez is a 50 year old male who I  actually saw back in November 2006.  He has a history of refractory GERD  as well as intermittent hematochezia felt to be due to hemorrhoids.  He  was scheduled for colonoscopy and EGD however, he tells me he chickened  out.  He has been having a significant amount of gas and bloating as  well as postprandial nausea and increased belching.  He has heartburn  and indigestion despite being on Nexium 40 mg daily at least three times  a week.  He denies any dysphagia or odynophagia.  He does have  occasional regurgitation.  He continues to notice small to moderate  amounts of bright red blood on the toilet paper with wiping, denies any  problems with clots.  He feels this is related to hemorrhoids.  He  denies any abdominal pain.  He generally has a bowel movement once or  twice a day.  Denies any constipation or diarrhea.   PAST MEDICAL HISTORY:  Hypertension, left knee pain, heart murmur,  chronic back pain, depression, left hand surgery twice.   CURRENT MEDICATION:  1. Hydrochlorothiazide 12.5 mg daily.  2. Diovan 160 mg daily.  3. Nexium 40 mg daily.  4. Fexofenadine 180 mg daily.  5. Naproxen 500 mg b.i.d.  6. Advil 200-400 mg p.r.n.  7. Lortab p.r.n.  8. Nasal spray p.r.n.  9. Albuterol p.r.n.   ALLERGIES:  No known drug allergies.   FAMILY HISTORY:  He has a sister with stomach problems. No known  family history of colorectal carcinoma or other chronic GI problems.   SOCIAL HISTORY:  Mr.  Rodriguez is married.  He is disabled.  He denies any  tobacco or  drug use.  He occasionally consumes a glass of wine.   REVIEW OF SYSTEMS:  See HPI, otherwise negative.   PHYSICAL EXAMINATION:  VITAL SIGNS:  Weight 275 pounds, height 72  inches, temperature 97.6, blood pressure 110/80, pulse 56.  GENERAL:  Parker Rodriguez is a 50 year old, obese, African-American male who  is alert, oriented, pleasant and cooperative in no acute distress.  HEENT:  Sclera clear, nonicteric.  Conjunctiva pink.  Oropharynx pink  and moist without any lesions.  NECK:  Supple without thyromegaly.  CHEST:  Heart regular rate and rhythm, normal S1, S2 with a 2/6 murmur  noted.  LUNGS:  Clear to auscultation bilaterally.  ABDOMEN:  Positive bowel sounds x4.  No bruits auscultated.  Soft,  nontender, nondistended without palpable mass or hepatosplenomegaly. No  __________  or guarding.  Exam is limited given the patient's body  habitus.  EXTREMITIES:  Without clubbing  or edema bilaterally.  SKIN:  Warm and dry without any rash or jaundice.   IMPRESSION:  Parker Rodriguez is a 50 year old African-American male with a  history of refractory gastroesophageal reflux disease symptoms despite  daily Nexium 40 mg.  He complains of postprandial nausea, gas, bloating  and belching along with occasional regurgitation.  I suspect he may have  gastritis or esophagitis.  He is going to need further evaluation.   He also has intermittent hematochezia which could be related to benign  anorectal source, however, given his at age we need to proceed with  colonoscopy to rule out colorectal carcinoma or polyps.   PLAN:  1. Esophagogastroduodenoscopy and colonoscopy with Dr. Jena Gauss in the      near future. I discussed the procedure including the risks and      benefits including but not limited to bleeding, infection,      perforation, drug reaction, if he agrees informed consent will be      obtained.  2. He is to continue Nexium 40  mg daily for now.  3. Further recommendations pending procedure.   We would like thank Dr. Erby Pian for allowing [place] to participate in the  care of Parker Rodriguez.      Lorenza Burton, N.P.      Jonathon Bellows, M.D.  Electronically Signed    KJ/MEDQ  D:  03/08/2007  T:  03/09/2007  Job:  [id number]

## 2010-12-06 NOTE — Consult Note (Signed)
NAMEALFONZIA, WOOLUM              ACCOUNT NO.:  [id number]   MEDICAL RECORD NO.:  [id number]          PATIENT TYPE:  AMB   LOCATION:                                FACILITY:  [facility]   PHYSICIAN:  Lionel December, M.D.    DATE OF BIRTH:  11/05/1960   DATE OF CONSULTATION:  05/22/2005  DATE OF DISCHARGE:                                   CONSULTATION   REFERRING PHYSICIAN:  Dorthula Rue. Early Chars, M.D.   REASON FOR CONSULTATION:  Refractory GERD symptoms, intermittent  hematochezia.   HISTORY OF PRESENT ILLNESS:  Mr. Camilli is a 50 year old, African-American  male who has had a 31-month history of heartburn, indigestion and increased  belching.  He also notes some epigastric pain, nausea, bloating without  vomiting.  He complains of his liquids and solids both feeling like they  get stuck retrosternally.  He has occasional regurgitation as well.  He  was started on Nexium 40 mg daily 2 months ago.  This provided minimal  relief and this was increased to b.i.d. 2 weeks ago which has provided 70%  relief of his symptoms, although he does continue to have pretty much daily  breakthrough symptoms.  He denies any odynophagia.  He does take 200 mg of  Advil daily as well as 1-2 Naproxen.  He also notes toilet tissue  hematochezia with small volume on at least three occasions.  He generally  has a soft, brown bowel movement every day or every other day.  He denies  any melena.  He does have history of hemorrhoids and feels he may have some  today.   PAST MEDICAL HISTORY:  1.  Hypertension.  2.  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  He is followed by Dr.      Early Chars for this.  3.  GERD.  4.  Left hand surgery twice.   CURRENT MEDICATIONS:  1.  Hydrochlorothiazide 2.5 mg daily.  2.  Diovan 160 mg daily.  3.  Lipitor 20 mg daily.  4.  Nexium 40 mg b.i.d.  5.  Fexofenadine 180 mg daily.  6.  Naproxen 500 mg b.i.d.  7.  Mobic 7.5 mg daily.  8.  Advil 200 mg one to two p.r.n. daily.  9.  Over-the-counter Gas-X p.r.n.   ALLERGIES:  No known drug allergies.   FAMILY HISTORY:  Positive for two paternal uncles with colon cancer  diagnosed at age 79.  Mother, age 4, has history of diabetes mellitus.  He  has two children with hypertension.  His father deceased with pneumonia at  age 58.  Two brothers deceased secondary to accidents.  He has three sisters  alive and one brother alive and healthy.   SOCIAL HISTORY:  Mr. Ballweg has been married for 20 years.  He has four  children.  He is currently disabled.  He denies any tobacco or drug use.  He  does consume occasional glass of wine.   REVIEW OF SYSTEMS:  CONSTITUTIONAL:  Weight is steadily decreasing  intentionally.  He denies any anorexia.  He denies any early satiety.  Denies any fevers or chills.  CARDIOVASCULAR:  He has had a cardiac  evaluation recently which has been normal per his report.  PULMONARY:  He  denies any shortness of breath, dyspnea, cough or hemoptysis.  GASTROINTESTINAL:  See HPI.   PHYSICAL EXAMINATION:  VITAL SIGNS:  Weight 269 pounds, height 72 inches,  temperature 98.1, blood pressure 118/72, pulse 64.  GENERAL:  Mr. Gladden is a 50 year old, well-developed, obese, African-  American male who is alert, oriented, pleasant and cooperative.  HEENT:  Sclerae clear, nonicteric.  Conjunctivae pink.  Oropharynx moist  without any lesions.  NECK:  Supple without any masses or thyromegaly.  HEART:  Regular rate and rhythm, normal S1, S2 without any murmurs, rubs or  gallops.  LUNGS:  Clear to auscultation bilaterally.  ABDOMEN:  Positive bowel sounds x4, no bruits auscultated.  Soft, nontender,  nondistended without palpable mass or hepatosplenomegaly.  No rebound  tenderness or guarding.  RECTAL:  He did have large, external, protruding hemorrhoids on exam which  are quite tender.  Internal exam was unable to be completed due to  significant tenderness.  EXTREMITIES:  Without edema  bilaterally.   IMPRESSION:  Mr. Lesser is a 50 year old, African-American male with 3-  month history of severe heartburn, epigastric pain, indigestion, nausea and  belching.  He has responded minimally to once daily proton pump inhibitor  therapy responding to about 70% twice daily proton pump inhibitor therapy.  We are dealing with refractory gastroesophageal reflux disease.  I feel he  should have evaluation of his upper gastrointestinal tract to rule out  complications of refractory gastroesophageal reflux disease.  He may have  erosive reflux esophagitis as he also has some dysphagia to both solids and  liquids.   On exam today, he has external hemorrhoids and will treat him for this.  This may be the source of his toilet tissue hematochezia.  However, given  his history of multiple episodes of hematochezia, we will proceed on with  colonoscopy to rule out colon polyps or colorectal carcinoma.   RECOMMENDATIONS:  1.  Colonoscopy and EGD will be scheduled with Dr. Karilyn Cota in the near      future.  I have discussed this procedure including risks and benefits      including, but not limited to bleeding, infection, perforation, drug      reaction.  He agrees with this plan and consent will be obtained.  I      have asked him to hold his Advil x3 days prior to the procedure.  2.  Colace 1-2 p.o. daily.  3.  Hemorrhoid and GERD literature were given for his review.  4.  We discussed standard GERD precautions and stressed continued gradual      weight loss.   We would like to thank Dr. Early Chars for allowing [place] to participate in the care  of Mr. Habibi.      Nicholas Lose, N.P.      Lionel December, M.D.  Electronically Signed    KC/MEDQ  D:  05/22/2005  T:  05/22/2005  Job:  [id number]   cc:   Dorthula Rue. Early Chars, MD  Fax: [phone]

## 2010-12-06 NOTE — Procedures (Signed)
Parker Rodriguez, Parker Rodriguez              ACCOUNT NO.:  [id number]   MEDICAL RECORD NO.:  [id number]          PATIENT TYPE:  OUT   LOCATION:  RAD                           FACILITY:  [facility]   PHYSICIAN:  Vida Roller, M.D.   DATE OF BIRTH:  [birth year]   DATE OF PROCEDURE:  02/28/2005  DATE OF DISCHARGE:                                  ECHOCARDIOGRAM   TAPE NUMBER:  [id number].  Tape count [id number].   INDICATIONS FOR PROCEDURE:  This is a 50 year old man with a murmur.  Technical quality of the study is difficult.   M-MODE TRACINGS:  Aorta 29 mm.   Left atrium 42 mm.   Septum 14 mm.   Posterior wall 12 mm.   Left ventricular diastolic dimension 45 mm.   Left ventricular systolic dimension 28 mm.   Two-dimensional echocardiogram and Doppler imaging:  The left ventricle is  normal size with preserved LV systolic function estimated at 60-65%.  There  is no obvious wall motion abnormality.  There is mild left ventricular  hypertrophy which is concentric.   Right ventricle is normal size with normal systolic function.   Left atrium is slightly enlarged.   The aortic valve is sclerotic and is not well seen.  There is very minimal  aortic stenosis with a peak velocity of 2.3 msec.  There is no aortic  insufficiency.   The mitral valve has mild annular calcification with no stenosis or  regurgitation.   Tricuspid valve is not well-seen, but does not appear to have any  significant valvular lesion.   Pulmonic valve not well-seen.   There is no pericardial effusion.   The ascending aorta and aortic arch to the limits of the study appear  normal.   The inferior vena cava was not well-seen.      Vida Roller, M.D.  Electronically Signed     JH/MEDQ  D:  02/28/2005  T:  02/28/2005  Job:  [id number]

## 2010-12-23 ENCOUNTER — Other Ambulatory Visit: Payer: Self-pay | Admitting: *Deleted

## 2010-12-23 NOTE — Telephone Encounter (Signed)
Denied pain med needs to make appt

## 2011-08-20 ENCOUNTER — Ambulatory Visit (INDEPENDENT_AMBULATORY_CARE_PROVIDER_SITE_OTHER): Payer: Medicare Other | Admitting: Gastroenterology

## 2011-08-20 ENCOUNTER — Encounter: Payer: Self-pay | Admitting: Gastroenterology

## 2011-08-20 VITALS — BP 127/78 | HR 75 | Temp 98.4°F | Ht 71.0 in | Wt 297.0 lb

## 2011-08-20 DIAGNOSIS — K625 Hemorrhage of anus and rectum: Secondary | ICD-10-CM

## 2011-08-20 MED ORDER — HYDROCORTISONE ACETATE 25 MG RE SUPP: 25.0000 mg | Freq: Two times a day (BID) | RECTAL | Status: DC

## 2011-08-20 MED ORDER — PEG-KCL-NACL-NASULF-NA ASC-C 100 G PO SOLR: 1.0000 | Freq: Once | ORAL | Status: DC

## 2011-08-20 MED ORDER — POLYETHYLENE GLYCOL 3350 17 GM/SCOOP PO POWD: 17.0000 g | Freq: Every day | ORAL | Status: AC

## 2011-08-20 NOTE — Patient Instructions (Signed)
Please review the high fiber diet. Continue taking stool softeners. You may take Miralax 1 capful as needed daily for a bowel movement.   I have sent a prescription for suppositories in case you have another hemorrhoid flare.  We have set you up for a screening colonoscopy with Dr. Jena Gauss in the near future. Further recommendations to follow once this is completed.

## 2011-08-20 NOTE — Progress Notes (Signed)
Referring Provider: Dr. Knowlton Primary Care Physician:  KNOWLTON,STEPHEN D, MD, MD Primary Gastroenterologist:  Dr. Rourk   Chief Complaint  Patient presents with  . Colonoscopy  . Hemorrhoids    HPI:   Parker Rodriguez presents today for a visit prior to initial screening colonoscopy. Reports intermittent rectal bleeding, itching, burning. Currently asymptomatic. Needs knee replacement, has been taking narcotics. Reports stool is hard. Intermittent constipation, takes stool softeners prn. No abdominal pain, weight loss, lack of appetite. Nexium for GERD, no dysphagia. 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    Past Medical History  Diagnosis Date  . Allergic rhinitis   . Hypertension     benign essentia  . Carpal tunnel syndrome   . Intervertebral disc disorder   . DM (diabetes mellitus)     family hx  . Obesity   . Peptic ulcer     remote past  . Hypercholesterolemia   . Asthma   . Depressive reaction   . Paroxysmal vertigo     Past Surgical History  Procedure Date  . Hand surgery     left    Current Outpatient Prescriptions  Medication Sig Dispense Refill  . albuterol (PROVENTIL) (2.5 MG/3ML) 0.083% nebulizer solution Take 2.5 mg by nebulization every 4 (four) hours as needed.       . amoxicillin-clavulanate (AUGMENTIN) 500-125 MG per tablet Take 1 tablet by mouth 3 (three) times daily.       . citalopram (CELEXA) 20 MG tablet Take 20 mg by mouth daily.       . HYDROcodone-acetaminophen (LORCET) 10-650 MG per tablet Take 1 tablet by mouth every 4 (four) hours as needed.       . ibuprofen (ADVIL,MOTRIN) 800 MG tablet Take 800 mg by mouth every 8 (eight) hours as needed.       . NEXIUM 40 MG capsule Take 40 mg by mouth daily before breakfast.       . potassium chloride SA (K-DUR,KLOR-CON) 20 MEQ tablet Take 20 mEq by mouth daily.       . valsartan-hydrochlorothiazide (DIOVAN-HCT) 160-12.5 MG per tablet Take 1 tablet by mouth daily.       . hydrocortisone (ANUSOL-HC) 25 MG  suppository Place 1 suppository (25 mg total) rectally every 12 (twelve) hours. For 7 days  14 suppository  0  . oxyCODONE-acetaminophen (PERCOCET) 5-325 MG per tablet Take 1 tablet by mouth every 4 (four) hours as needed for pain.  20 tablet  0  . peg 3350 powder (MOVIPREP) 100 G SOLR Take 1 kit (100 g total) by mouth once. As directed Please purchase 1 Fleets enema to use with the prep  1 kit  0  . penicillin v potassium (VEETID) 500 MG tablet Take 1 tablet (500 mg total) by mouth 4 (four) times daily. For 10 days  40 tablet  0  . polyethylene glycol powder (MIRALAX) powder Take 17 g by mouth daily. As needed for a bowel movement.  255 g  3    Allergies as of 08/20/2011  . (No Known Allergies)    Family History  Problem Relation Age of Onset  . Colon cancer Paternal Uncle   . Pneumonia Father     died at age 32    History   Social History  . Marital Status: Married    Spouse Name: N/A    Number of Children: 4  . Years of Education: N/A   Occupational History  . 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 Smoking status: Never Smoker   . Smokeless tobacco: Not on file  . Alcohol Use: Yes     glass of wine every now and then  . Drug Use: No  . Sexually Active: Not on file   Other Topics Concern  . Not on file   Social History Narrative  . No narrative on file    Review of Systems: Gen: Denies any fever, chills, loss of appetite, fatigue, weight loss. CV: Denies chest pain, heart palpitations, syncope, peripheral edema. Resp: Denies shortness of breath with rest, cough, wheezing GI: Denies dysphagia or odynophagia. Denies hematemesis, fecal incontinence, or jaundice.  GU : Denies urinary burning, urinary frequency, urinary incontinence.  MS: Denies joint pain, muscle weakness, cramps, limited movement Derm: Denies rash, itching, dry skin Psych: Denies depression, anxiety, confusion or memory loss  Heme: Denies bruising,  bleeding, and enlarged lymph nodes.  Physical Exam: BP 127/78  Pulse 75  Temp(Src) 98.4 F (36.9 C) (Temporal)  Ht 5' 11" (1.803 m)  Wt 297 lb (134.718 kg)  BMI 41.42 kg/m2 General:   Alert and oriented. Well-developed, well-nourished, pleasant and cooperative. Head:  Normocephalic and atraumatic. Eyes:  Conjunctiva pink, sclera clear, no icterus.   Conjunctiva pink. Ears:  Normal auditory acuity. Nose:  No deformity, discharge,  or lesions. Mouth:  No deformity or lesions, mucosa pink and moist.  Neck:  Supple, without mass or thyromegaly. Lungs:  Clear to auscultation bilaterally, without wheezing, rales, or rhonchi.  Heart:  S1, S2 present without murmurs noted.  Abdomen:  +BS, soft, non-tender and non-distended. Without mass or HSM. No rebound or guarding. No hernias noted. Rectal:  Deferred  Msk:  Symmetrical without gross deformities. Normal posture. Extremities:  Without clubbing or edema. Neurologic:  Alert and  oriented x4;  grossly normal neurologically. Skin:  Intact, warm and dry without significant lesions or rashes Cervical Nodes:  No significant cervical adenopathy. Psych:  Alert and cooperative. Normal mood and affect.   

## 2011-08-23 ENCOUNTER — Encounter (HOSPITAL_COMMUNITY): Payer: Self-pay | Admitting: *Deleted

## 2011-08-23 ENCOUNTER — Emergency Department (HOSPITAL_COMMUNITY)
Admission: EM | Admit: 2011-08-23 | Discharge: 2011-08-23 | Disposition: A | Discharge status: Home / Self Care | Payer: Medicare Other | Attending: Emergency Medicine | Admitting: Emergency Medicine

## 2011-08-23 DIAGNOSIS — M519 Unspecified thoracic, thoracolumbar and lumbosacral intervertebral disc disorder: Secondary | ICD-10-CM | POA: Insufficient documentation

## 2011-08-23 DIAGNOSIS — J45909 Unspecified asthma, uncomplicated: Secondary | ICD-10-CM | POA: Insufficient documentation

## 2011-08-23 DIAGNOSIS — E119 Type 2 diabetes mellitus without complications: Secondary | ICD-10-CM | POA: Insufficient documentation

## 2011-08-23 DIAGNOSIS — I10 Essential (primary) hypertension: Secondary | ICD-10-CM | POA: Insufficient documentation

## 2011-08-23 DIAGNOSIS — K029 Dental caries, unspecified: Secondary | ICD-10-CM | POA: Insufficient documentation

## 2011-08-23 DIAGNOSIS — E7800 Pure hypercholesterolemia, unspecified: Secondary | ICD-10-CM | POA: Insufficient documentation

## 2011-08-23 DIAGNOSIS — K0889 Other specified disorders of teeth and supporting structures: Secondary | ICD-10-CM

## 2011-08-23 LAB — GLUCOSE, CAPILLARY: Glucose-Capillary: 111 mg/dL — ABNORMAL HIGH (ref 70–99)

## 2011-08-23 MED ORDER — OXYCODONE-ACETAMINOPHEN 5-325 MG PO TABS
2.0000 | ORAL_TABLET | Freq: Once | ORAL | Status: AC
  Administered 2011-08-23: 2 via ORAL
  Filled 2011-08-23: qty 2

## 2011-08-23 MED ORDER — OXYCODONE-ACETAMINOPHEN 5-325 MG PO TABS: 1.0000 | ORAL_TABLET | ORAL | Status: AC | PRN

## 2011-08-23 MED ORDER — PENICILLIN V POTASSIUM 500 MG PO TABS: 500.0000 mg | ORAL_TABLET | Freq: Four times a day (QID) | ORAL | Status: AC

## 2011-08-23 MED ORDER — PENICILLIN V POTASSIUM 250 MG PO TABS
500.0000 mg | ORAL_TABLET | Freq: Four times a day (QID) | ORAL | Status: DC
  Administered 2011-08-23: 500 mg via ORAL
  Filled 2011-08-23: qty 2

## 2011-08-23 NOTE — ED Provider Notes (Signed)
History     CSN: [id number]  Arrival date & time 08/23/11  1540   First MD Initiated Contact with Patient 08/23/11 1550      Chief Complaint  Patient presents with  . Dental Pain    (Consider location/radiation/quality/duration/timing/severity/associated sxs/prior treatment) Patient is a 51 y.o. male presenting with tooth pain. The history is provided by the patient. No language interpreter was used.  Dental PainThe primary symptoms include mouth pain. Primary symptoms do not include headaches, fever, shortness of breath or sore throat. The symptoms began 2 days ago. The symptoms are worsening. The symptoms are new. The symptoms occur constantly.  Mouth pain began 24 -48 hours ago. Mouth pain occurs constantly. Mouth pain is worsening. Affected locations include: gum(s) and teeth. The mouth pain is currently at 10/10.  Additional symptoms include: dental sensitivity to temperature and gum tenderness. Additional symptoms do not include: gum swelling, purulent gums, trismus, jaw pain, facial swelling, trouble swallowing, pain with swallowing, ear pain and swollen glands. Medical issues include: periodontal disease. Medical issues do not include: smoking.    Past Medical History  Diagnosis Date  . Allergic rhinitis   . Hypertension     benign essentia  . Carpal tunnel syndrome   . Intervertebral disc disorder   . DM (diabetes mellitus)     family hx  . Obesity   . Peptic ulcer     remote past  . Hypercholesterolemia   . Asthma   . Depressive reaction   . Paroxysmal vertigo     Past Surgical History  Procedure Date  . Hand surgery     left    Family History  Problem Relation Age of Onset  . Colon cancer Paternal Uncle   . Pneumonia Father     died at age 46    History  Substance Use Topics  . Smoking status: Never Smoker   . Smokeless tobacco: Not on file  . Alcohol Use: Yes     glass of wine every now and then      Review of Systems  Constitutional: Negative  for fever and chills.  HENT: Positive for dental problem. Negative for ear pain, sore throat, facial swelling, trouble swallowing and neck pain.   Respiratory: Negative for shortness of breath.   Cardiovascular: Negative for chest pain.  Genitourinary: Negative for dysuria.  Musculoskeletal: Negative for myalgias.  Skin: Negative for rash.  Neurological: Negative for dizziness, weakness, numbness and headaches.  Hematological: Negative for adenopathy. Does not bruise/bleed easily.  All other systems reviewed and are negative.    Allergies  Review of patient's allergies indicates no known allergies.  Home Medications   Current Outpatient Rx  Name Route Sig Dispense Refill  . ALBUTEROL SULFATE (2.5 MG/3ML) 0.083% IN NEBU Nebulization Take 2.5 mg by nebulization every 4 (four) hours as needed.     . AMOXICILLIN-POT CLAVULANATE 500-125 MG PO TABS Oral Take 1 tablet by mouth 3 (three) times daily.     Marland Kitchen CITALOPRAM HYDROBROMIDE 20 MG PO TABS Oral Take 20 mg by mouth daily.     Marland Kitchen HYDROCODONE-ACETAMINOPHEN 10-650 MG PO TABS Oral Take 1 tablet by mouth every 4 (four) hours as needed.     Marland Kitchen HYDROCORTISONE ACETATE 25 MG RE SUPP Rectal Place 1 suppository (25 mg total) rectally every 12 (twelve) hours. For 7 days 14 suppository 0  . IBUPROFEN 800 MG PO TABS Oral Take 800 mg by mouth every 8 (eight) hours as needed.     Marland Kitchen  NEXIUM 40 MG PO CPDR Oral Take 40 mg by mouth daily before breakfast.     . PEG-KCL-NACL-NASULF-NA ASC-C 100 G PO SOLR Oral Take 1 kit (100 g total) by mouth once. As directed Please purchase 1 Fleets enema to use with the prep 1 kit 0  . POLYETHYLENE GLYCOL 3350 PO POWD Oral Take 17 g by mouth daily. As needed for a bowel movement. 255 g 3  . POTASSIUM CHLORIDE CRYS ER 20 MEQ PO TBCR Oral Take 20 mEq by mouth daily.     Marland Kitchen VALSARTAN-HYDROCHLOROTHIAZIDE 160-12.5 MG PO TABS Oral Take 1 tablet by mouth daily.       BP 127/71  Pulse 73  Temp(Src) 97.4 F (36.3 C) (Oral)  Resp  20  Ht 5\' 11"  (1.803 m)  Wt 280 lb (127.007 kg)  BMI 39.05 kg/m2  SpO2 100%  Physical Exam  Nursing note and vitals reviewed. Constitutional: He is oriented to person, place, and time. He appears well-developed and well-nourished. No distress.  HENT:  Head: Normocephalic and atraumatic. No trismus in the jaw.  Right Ear: Tympanic membrane and ear canal normal.  Left Ear: Tympanic membrane and ear canal normal.  Mouth/Throat: Uvula is midline, oropharynx is clear and moist and mucous membranes are normal. Dental caries present. No dental abscesses or uvula swelling.         Dental decay of the left lower second premolar.  Mild tenderness to palpation of the surrounding gums. No obvious dental abscess at this time.  Neck: Normal range of motion. Neck supple.  Cardiovascular: Normal rate, regular rhythm and normal heart sounds.   Pulmonary/Chest: Effort normal and breath sounds normal. No respiratory distress.  Musculoskeletal: Normal range of motion. He exhibits no tenderness.  Lymphadenopathy:    He has no cervical adenopathy.  Neurological: He is alert and oriented to person, place, and time. He exhibits normal muscle tone. Coordination normal.  Skin: Skin is warm and dry.    ED Course  Procedures (including critical care time)        MDM    Dental caries without obvious abscess, no trismus or facial swelling.  Vitals stable, he is nontoxic appearing. He agrees to close followup with his dentist.      Elexia Friedt L. [place], [place] 08/23/11 1730

## 2011-08-23 NOTE — ED Notes (Signed)
Pt a/ox4. Resp even and unlabored. NAD at this time. D/C instructions reviewed with pt. Pt verbalized understanding. Pt ambulated to lobby with steady gate.  

## 2011-08-23 NOTE — ED Notes (Signed)
"  bad pain and tooth infection" x 2 days, per pt. Pain to lower left tooth

## 2011-08-24 DIAGNOSIS — K625 Hemorrhage of anus and rectum: Secondary | ICD-10-CM | POA: Insufficient documentation

## 2011-08-24 NOTE — Assessment & Plan Note (Signed)
51 year old male with intermittent low-volume hematochezia, likely benign anorectal source. Intermittent rectal itching/burning noted. Currently asymptomatic. Intermittent constipation noted with hard stools. Taking narcotics prn knee pain, needs replacement. Likely constipation secondary to medication. No prior TCS, will proceed with lower GI evaluation.   Proceed with TCS with Dr. Jena Gauss in near future: the risks, benefits, and alternatives have been discussed with the patient in detail. The patient states understanding and desires to proceed. High Fiber diet handout Colace BID Miralax prn Anusol suppositories provided if further issues

## 2011-08-25 NOTE — Progress Notes (Signed)
Faxed to PCP

## 2011-08-25 NOTE — ED Provider Notes (Signed)
Medical screening examination/treatment/procedure(s) were performed by non-physician practitioner and as supervising physician I was immediately available for consultation/collaboration.  Nicoletta Dress. Colon Branch, MD 08/25/11 [phone]

## 2011-08-27 ENCOUNTER — Telehealth: Payer: Self-pay | Admitting: Gastroenterology

## 2011-08-27 NOTE — Telephone Encounter (Signed)
Pharmacy has Patients medication

## 2011-09-10 ENCOUNTER — Encounter (HOSPITAL_COMMUNITY): Payer: Self-pay | Admitting: Pharmacy Technician

## 2011-09-10 MED ORDER — SODIUM CHLORIDE 0.45 % IV SOLN
Freq: Once | INTRAVENOUS | Status: AC
  Administered 2011-09-11: 09:00:00 via INTRAVENOUS

## 2011-09-11 ENCOUNTER — Encounter (HOSPITAL_COMMUNITY): Payer: Self-pay | Admitting: *Deleted

## 2011-09-11 ENCOUNTER — Ambulatory Visit (HOSPITAL_COMMUNITY)
Admission: RE | Admit: 2011-09-11 | Discharge: 2011-09-11 | Disposition: A | Discharge status: Home / Self Care | Payer: Medicare Other | Source: Ambulatory Visit | Attending: Internal Medicine | Admitting: Internal Medicine

## 2011-09-11 ENCOUNTER — Other Ambulatory Visit: Payer: Self-pay | Admitting: Internal Medicine

## 2011-09-11 ENCOUNTER — Encounter (HOSPITAL_COMMUNITY)
Admission: RE | Disposition: A | Discharge status: Home / Self Care | Payer: Self-pay | Source: Ambulatory Visit | Attending: Internal Medicine

## 2011-09-11 DIAGNOSIS — E119 Type 2 diabetes mellitus without complications: Secondary | ICD-10-CM | POA: Insufficient documentation

## 2011-09-11 DIAGNOSIS — K921 Melena: Secondary | ICD-10-CM | POA: Insufficient documentation

## 2011-09-11 DIAGNOSIS — I10 Essential (primary) hypertension: Secondary | ICD-10-CM | POA: Insufficient documentation

## 2011-09-11 DIAGNOSIS — E7800 Pure hypercholesterolemia, unspecified: Secondary | ICD-10-CM | POA: Insufficient documentation

## 2011-09-11 DIAGNOSIS — K644 Residual hemorrhoidal skin tags: Secondary | ICD-10-CM | POA: Insufficient documentation

## 2011-09-11 DIAGNOSIS — Z79899 Other long term (current) drug therapy: Secondary | ICD-10-CM | POA: Insufficient documentation

## 2011-09-11 DIAGNOSIS — D126 Benign neoplasm of colon, unspecified: Secondary | ICD-10-CM | POA: Insufficient documentation

## 2011-09-11 DIAGNOSIS — K625 Hemorrhage of anus and rectum: Secondary | ICD-10-CM

## 2011-09-11 HISTORY — PX: COLONOSCOPY: SHX5424

## 2011-09-11 HISTORY — DX: Unspecified osteoarthritis, unspecified site: M19.90

## 2011-09-11 HISTORY — DX: Anxiety disorder, unspecified: F41.9

## 2011-09-11 SURGERY — COLONOSCOPY
Anesthesia: Moderate Sedation

## 2011-09-11 MED ORDER — STERILE WATER FOR IRRIGATION IR SOLN
Status: DC | PRN
  Administered 2011-09-11: 09:00:00

## 2011-09-11 MED ORDER — MEPERIDINE HCL 100 MG/ML IJ SOLN
INTRAMUSCULAR | Status: DC | PRN
  Administered 2011-09-11 (×2): 25 mg via INTRAVENOUS
  Administered 2011-09-11: 50 mg via INTRAVENOUS

## 2011-09-11 MED ORDER — PROMETHAZINE HCL 25 MG/ML IJ SOLN
INTRAMUSCULAR | Status: AC
  Filled 2011-09-11: qty 1

## 2011-09-11 MED ORDER — MIDAZOLAM HCL 5 MG/5ML IJ SOLN
INTRAMUSCULAR | Status: DC | PRN
  Administered 2011-09-11 (×3): 1 mg via INTRAVENOUS
  Administered 2011-09-11: 2 mg via INTRAVENOUS
  Administered 2011-09-11: 1 mg via INTRAVENOUS

## 2011-09-11 MED ORDER — HYDROCORTISONE ACETATE 25 MG RE SUPP: 25.0000 mg | Freq: Two times a day (BID) | RECTAL | Status: AC

## 2011-09-11 MED ORDER — SODIUM CHLORIDE 0.9 % IJ SOLN
INTRAMUSCULAR | Status: AC
  Filled 2011-09-11: qty 10

## 2011-09-11 MED ORDER — MIDAZOLAM HCL 5 MG/5ML IJ SOLN
INTRAMUSCULAR | Status: AC
  Filled 2011-09-11: qty 10

## 2011-09-11 MED ORDER — PROMETHAZINE HCL 25 MG/ML IJ SOLN
25.0000 mg | Freq: Once | INTRAMUSCULAR | Status: AC
  Administered 2011-09-11: 25 mg via INTRAVENOUS

## 2011-09-11 MED ORDER — MEPERIDINE HCL 100 MG/ML IJ SOLN
INTRAMUSCULAR | Status: AC
  Filled 2011-09-11: qty 2

## 2011-09-11 NOTE — Interval H&P Note (Signed)
History and Physical Interval Note:  09/11/2011 9:25 AM  Parker Rodriguez  has presented today for surgery, with the diagnosis of rectal bleeding  The various methods of treatment have been discussed with the patient and family. After consideration of risks, benefits and other options for treatment, the patient has consented to  Procedure(s) (LRB): COLONOSCOPY (N/A) as a surgical intervention .  The patients' history has been reviewed, patient examined, no change in status, stable for surgery.  I have reviewed the patients' chart and labs.  Questions were answered to the patient's satisfaction.     Eula Listen

## 2011-09-11 NOTE — H&P (View-Only) (Signed)
Referring Provider: Dr. Sudie Bailey Primary Care Physician:  Milana Obey, MD, MD Primary Gastroenterologist:  Dr. Jena Gauss   Chief Complaint  Patient presents with  . Colonoscopy  . Hemorrhoids    HPI:   Parker Rodriguez presents today for a visit prior to initial screening colonoscopy. Reports intermittent rectal bleeding, itching, burning. Currently asymptomatic. Needs knee replacement, has been taking narcotics. Reports stool is hard. Intermittent constipation, takes stool softeners prn. No abdominal pain, weight loss, lack of appetite. Nexium for GERD, no dysphagia. 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    Past Medical History  Diagnosis Date  . Allergic rhinitis   . Hypertension     benign essentia  . Carpal tunnel syndrome   . Intervertebral disc disorder   . DM (diabetes mellitus)     family hx  . Obesity   . Peptic ulcer     remote past  . Hypercholesterolemia   . Asthma   . Depressive reaction   . Paroxysmal vertigo     Past Surgical History  Procedure Date  . Hand surgery     left    Current Outpatient Prescriptions  Medication Sig Dispense Refill  . albuterol (PROVENTIL) (2.5 MG/3ML) 0.083% nebulizer solution Take 2.5 mg by nebulization every 4 (four) hours as needed.       Marland Kitchen amoxicillin-clavulanate (AUGMENTIN) 500-125 MG per tablet Take 1 tablet by mouth 3 (three) times daily.       . citalopram (CELEXA) 20 MG tablet Take 20 mg by mouth daily.       Marland Kitchen HYDROcodone-acetaminophen (LORCET) 10-650 MG per tablet Take 1 tablet by mouth every 4 (four) hours as needed.       Marland Kitchen ibuprofen (ADVIL,MOTRIN) 800 MG tablet Take 800 mg by mouth every 8 (eight) hours as needed.       Marland Kitchen NEXIUM 40 MG capsule Take 40 mg by mouth daily before breakfast.       . potassium chloride SA (K-DUR,KLOR-CON) 20 MEQ tablet Take 20 mEq by mouth daily.       . valsartan-hydrochlorothiazide (DIOVAN-HCT) 160-12.5 MG per tablet Take 1 tablet by mouth daily.       . hydrocortisone (ANUSOL-HC) 25 MG  suppository Place 1 suppository (25 mg total) rectally every 12 (twelve) hours. For 7 days  14 suppository  0  . oxyCODONE-acetaminophen (PERCOCET) 5-325 MG per tablet Take 1 tablet by mouth every 4 (four) hours as needed for pain.  20 tablet  0  . peg 3350 powder (MOVIPREP) 100 G SOLR Take 1 kit (100 g total) by mouth once. As directed Please purchase 1 Fleets enema to use with the prep  1 kit  0  . penicillin v potassium (VEETID) 500 MG tablet Take 1 tablet (500 mg total) by mouth 4 (four) times daily. For 10 days  40 tablet  0  . polyethylene glycol powder (MIRALAX) powder Take 17 g by mouth daily. As needed for a bowel movement.  255 g  3    Allergies as of 08/20/2011  . (No Known Allergies)    Family History  Problem Relation Age of Onset  . Colon cancer Paternal Uncle   . Pneumonia Father     died at age 20    History   Social History  . Marital Status: Married    Spouse Name: N/A    Number of Children: 4  . Years of Education: N/A   Occupational History  . retirement disability     used to  be Personnel officer   Social History Main Topics  . Smoking status: Never Smoker   . Smokeless tobacco: Not on file  . Alcohol Use: Yes     glass of wine every now and then  . Drug Use: No  . Sexually Active: Not on file   Other Topics Concern  . Not on file   Social History Narrative  . No narrative on file    Review of Systems: Gen: Denies any fever, chills, loss of appetite, fatigue, weight loss. CV: Denies chest pain, heart palpitations, syncope, peripheral edema. Resp: Denies shortness of breath with rest, cough, wheezing GI: Denies dysphagia or odynophagia. Denies hematemesis, fecal incontinence, or jaundice.  GU : Denies urinary burning, urinary frequency, urinary incontinence.  MS: Denies joint pain, muscle weakness, cramps, limited movement Derm: Denies rash, itching, dry skin Psych: Denies depression, anxiety, confusion or memory loss  Heme: Denies bruising,  bleeding, and enlarged lymph nodes.  Physical Exam: BP 127/78  Pulse 75  Temp(Src) 98.4 F (36.9 C) (Temporal)  Ht 5\' 11"  (1.803 m)  Wt 297 lb (134.718 kg)  BMI 41.42 kg/m2 General:   Alert and oriented. Well-developed, well-nourished, pleasant and cooperative. Head:  Normocephalic and atraumatic. Eyes:  Conjunctiva pink, sclera clear, no icterus.   Conjunctiva pink. Ears:  Normal auditory acuity. Nose:  No deformity, discharge,  or lesions. Mouth:  No deformity or lesions, mucosa pink and moist.  Neck:  Supple, without mass or thyromegaly. Lungs:  Clear to auscultation bilaterally, without wheezing, rales, or rhonchi.  Heart:  S1, S2 present without murmurs noted.  Abdomen:  +BS, soft, non-tender and non-distended. Without mass or HSM. No rebound or guarding. No hernias noted. Rectal:  Deferred  Msk:  Symmetrical without gross deformities. Normal posture. Extremities:  Without clubbing or edema. Neurologic:  Alert and  oriented x4;  grossly normal neurologically. Skin:  Intact, warm and dry without significant lesions or rashes Cervical Nodes:  No significant cervical adenopathy. Psych:  Alert and cooperative. Normal mood and affect.

## 2011-09-11 NOTE — Op Note (Signed)
[facility] [address] [place], [place]  [address]  COLONOSCOPY PROCEDURE REPORT  PATIENT:  Parker Rodriguez, Parker Rodriguez  MR#:  [id number] BIRTHDATE:  November 08, 1960, 50 yrs. old  GENDER:  male ENDOSCOPIST:  R. Roetta Sessions, MD FACP [facility] REF. BY:          Dr. Sudie Bailey PROCEDURE DATE:  09/11/2011 PROCEDURE:  colonoscopy with snare polypectomy  INDICATIONS:  hematochezia; first ever colonoscopy  INFORMED CONSENT:  The risks, benefits, alternatives and imponderables including but not limited to bleeding, perforation as well as the possibility of a missed lesion have been reviewed. The potential for biopsy, lesion removal, etc. have also been discussed.  Questions have been answered.  All parties agreeable. Please see the history and physical in the medical record for more information.  MEDICATIONS:  Phenergan 25 mg IV, Versed 6 mg IV and Demerol 100 mg IV  DESCRIPTION OF PROCEDURE:  After a digital rectal exam was performed, the EC-[id number] ([id number]) colonoscope was advanced from the anus through the rectum and colon to the area of the cecum, ileocecal valve and appendiceal orifice.  The cecum was deeply intubated.  These structures were well-seen and photographed for the record.  From the level of the cecum and ileocecal valve, the scope was slowly and cautiously withdrawn.  The mucosal surfaces were carefully surveyed utilizing scope tip deflection to facilitate fold flattening as needed.  The scope was pulled down into the rectum where a thorough examination including retroflexion was performed. <<PROCEDUREIMAGES>>  FINDINGS:  external hemorrhoidal tags and anal papilla; otherwise normal. 1.5 cm pedunculated polyp at the junction of sigmoid and descending colon.  A single 4 mm polyp at the splenic flexure; right o'clock this appeared normal.  THERAPEUTIC / DIAGNOSTIC MANEUVERS PERFORMED:   both polyps above removed with hot and cold snare polypectomy  technique, respectively.  COMPLICATIONS:  none  CECAL WITHDRAWAL TIME:13 minutes  IMPRESSION:  Colonic polyps-removed as described above. Hemorrhoids.  RECOMMENDATIONS:   Follow up on pathology. Course of Anusol suppositories.  ______________________________ R. Roetta Sessions, MD Caleen Essex  CC:  n. eSIGNED:   R. Roetta Sessions at 09/11/2011 10:10 AM  Andree Coss, [phone]

## 2011-09-11 NOTE — Discharge Instructions (Addendum)
Colonoscopy Discharge Instructions  Read the instructions outlined below and refer to this sheet in the next few weeks. These discharge instructions provide you with general information on caring for yourself after you leave the hospital. Your doctor may also give you specific instructions. While your treatment has been planned according to the most current medical practices available, unavoidable complications occasionally occur. If you have any problems or questions after discharge, call Dr. Jena Gauss at [phone]. ACTIVITY  You may resume your regular activity, but move at a slower pace for the next 24 hours.   Take frequent rest periods for the next 24 hours.   Walking will help get rid of the air and reduce the bloated feeling in your belly (abdomen).   No driving for 24 hours (because of the medicine (anesthesia) used during the test).    Do not sign any important legal documents or operate any machinery for 24 hours (because of the anesthesia used during the test).  NUTRITION  Drink plenty of fluids.   You may resume your normal diet as instructed by your doctor.   Begin with a light meal and progress to your normal diet. Heavy or fried foods are harder to digest and may make you feel sick to your stomach (nauseated).   Avoid alcoholic beverages for 24 hours or as instructed.  MEDICATIONS  You may resume your normal medications unless your doctor tells you otherwise.  WHAT YOU CAN EXPECT TODAY  Some feelings of bloating in the abdomen.   Passage of more gas than usual.   Spotting of blood in your stool or on the toilet paper.  IF YOU HAD POLYPS REMOVED DURING THE COLONOSCOPY:  No aspirin products for 7 days or as instructed.   No alcohol for 7 days or as instructed.   Eat a soft diet for the next 24 hours.  FINDING OUT THE RESULTS OF YOUR TEST Not all test results are available during your visit. If your test results are not back during the visit, make an appointment  with your caregiver to find out the results. Do not assume everything is normal if you have not heard from your caregiver or the medical facility. It is important for you to follow up on all of your test results.  SEEK IMMEDIATE MEDICAL ATTENTION IF:  You have more than a spotting of blood in your stool.   Your belly is swollen (abdominal distention).   You are nauseated or vomiting.   You have a temperature over 101.   You have abdominal pain or discomfort that is severe or gets worse throughout the day.    Polyp and hemorrhoid information provided.  Anusol suppositories as directed.  Further recommendations to follow pending review of pathology report.  Colonoscopy Care After These instructions give you information on caring for yourself after your procedure. Your doctor may also give you more specific instructions. Call your doctor if you have any problems or questions after your procedure. HOME CARE  Take it easy for the next 24 hours.   Rest.   Walk or use warm packs on your belly (abdomen) if you have belly cramping or gas.   Do not drive for 24 hours.   You may shower.   Do not sign important papers or use machinery for 24 hours.   Drink enough fluids to keep your pee (urine) clear or pale yellow.   Resume your normal diet. Avoid heavy or fried foods.   Avoid alcohol.   Continue taking your  normal medicines.   Only take medicine as told by your doctor. Do not take aspirin.  If you had growths (polyps) removed:  Do not take aspirin.   Do not drink alcohol for 7 days or as told by your doctor.   Eat a soft diet for 24 hours.  GET HELP RIGHT AWAY IF:  You have a fever.   You pass clumps of tissue (blood clots) or fill the toilet with blood.   You have belly pain that gets worse and medicine does not help.   Your belly is puffy (swollen).   You feel sick to your stomach (nauseous) or throw up (vomit).  MAKE SURE YOU:  Understand these instructions.    Will watch your condition.   Will get help right away if you are not doing well or get worse.  Document Released: 08/09/2010 Document Revised: 03/19/2011 Document Reviewed: 08/09/2010 [facility] Patient Information 2012 [place], [place].Hemorrhoids Hemorrhoids are veins in the rectum that get big. These veins can get blocked. Blocked veins become puffy (swollen) and painful. HOME CARE  Eat more fiber.   Drink enough fluid to keep your pee (urine) clear or pale yellow.   Exercise often.   Avoid straining to poop (bowel movement).   Keep the butt area dry and clean.   Only take medicine as told by your doctor.  If your hemorrhoids are puffy and painful:  Take a warm bath for 20 to 30 minutes. Do this 3 to 4 times a day.   Place ice packs on the area. Use the ice packs between the baths.   Put ice in a plastic bag.   Place a towel between your skin and the bag.   Leave the ice on for 15 to 20 minutes, 3 to 4 times a day.   Do not use a donut-shaped pillow. Do not sit on the toilet for a long time.   Go to the bathroom when your body has the urge to poop. This is so you do not strain as much to poop.  GET HELP RIGHT AWAY IF:   You have increasing pain that is not controlled with medicine.   You have uncontrolled bleeding.   You cannot poop.   You have pain or puffiness outside the area of the hemorrhoids.   You have chills.   You have a temperature by mouth above 102 F (38.9 C), not controlled by medicine.  MAKE SURE YOU:   Understand these instructions.   Will watch your condition.   Will get help right away if you are not doing well or get worse.  Document Released: 04/15/2008 Document Revised: 03/19/2011 Document Reviewed: 04/15/2008 [facility] Patient Information 2012 [place], [place].Colon Polyps A polyp is extra tissue that grows inside your body. Colon polyps grow in the large intestine. The large intestine, also called the colon, is part of your digestive  system. It is a long, hollow tube at the end of your digestive tract where your body makes and stores stool. Most polyps are not dangerous. They are benign. This means they are not cancerous. But over time, some types of polyps can turn into cancer. Polyps that are smaller than a pea are usually not harmful. But larger polyps could someday become or may already be cancerous. To be safe, doctors remove all polyps and test them.  WHO GETS POLYPS? Anyone can get polyps, but certain people are more likely than others. You may have a greater chance of getting polyps if:  You are  over 50.   You have had polyps before.   Someone in your family has had polyps.   Someone in your family has had cancer of the large intestine.   Find out if someone in your family has had polyps. You may also be more likely to get polyps if you:   Eat a lot of fatty foods.   Smoke.   Drink alcohol.   Do not exercise.   Eat too much.  SYMPTOMS  Most small polyps do not cause symptoms. People often do not know they have one until their caregiver finds it during a regular checkup or while testing them for something else. Some people do have symptoms like these:  Bleeding from the anus. You might notice blood on your underwear or on toilet paper after you have had a bowel movement.   Constipation or diarrhea that lasts more than a week.   Blood in the stool. Blood can make stool look black or it can show up as red streaks in the stool.  If you have any of these symptoms, see your caregiver. HOW DOES THE DOCTOR TEST FOR POLYPS? The doctor can use four tests to check for polyps:  Digital rectal exam. The caregiver wears gloves and checks your rectum (the last part of the large intestine) to see if it feels normal. This test would find polyps only in the rectum. Your caregiver may need to do one of the other tests listed below to find polyps higher up in the intestine.   Barium enema. The caregiver puts a liquid  called barium into your rectum before taking x-rays of your large intestine. Barium makes your intestine look white in the pictures. Polyps are dark, so they are easy to see.   Sigmoidoscopy. With this test, the caregiver can see inside your large intestine. A thin flexible tube is placed into your rectum. The device is called a sigmoidoscope, which has a light and a tiny video camera in it. The caregiver uses the sigmoidoscope to look at the last third of your large intestine.   Colonoscopy. This test is like sigmoidoscopy, but the caregiver looks at all of the large intestine. It usually requires sedation. This is the most common method for finding and removing polyps.  TREATMENT   The caregiver will remove the polyp during sigmoidoscopy or colonoscopy. The polyp is then tested for cancer.   If you have had polyps, your caregiver may want you to get tested regularly in the future.  PREVENTION  There is not one sure way to prevent polyps. You might be able to lower your risk of getting them if you:  Eat more fruits and vegetables and less fatty food.   Do not smoke.   Avoid alcohol.   Exercise every day.   Lose weight if you are overweight.   Eating more calcium and folate can also lower your risk of getting polyps. Some foods that are rich in calcium are milk, cheese, and broccoli. Some foods that are rich in folate are chickpeas, kidney beans, and spinach.   Aspirin might help prevent polyps. Studies are under way.  Document Released: 04/02/2004 Document Revised: 03/19/2011 Document Reviewed: 09/08/2007 [facility] Patient Information 2012 [place], [place].

## 2011-09-13 ENCOUNTER — Encounter: Payer: Self-pay | Admitting: Internal Medicine

## 2011-09-17 ENCOUNTER — Encounter (HOSPITAL_COMMUNITY): Payer: Self-pay | Admitting: Internal Medicine

## 2011-10-16 ENCOUNTER — Telehealth: Payer: Self-pay | Admitting: Orthopedic Surgery

## 2011-10-16 ENCOUNTER — Encounter: Payer: Self-pay | Admitting: Orthopedic Surgery

## 2011-10-16 ENCOUNTER — Ambulatory Visit (INDEPENDENT_AMBULATORY_CARE_PROVIDER_SITE_OTHER): Payer: Medicare Other | Admitting: Orthopedic Surgery

## 2011-10-16 ENCOUNTER — Other Ambulatory Visit: Payer: Self-pay | Admitting: Orthopedic Surgery

## 2011-10-16 ENCOUNTER — Other Ambulatory Visit: Payer: Self-pay | Admitting: *Deleted

## 2011-10-16 VITALS — BP 122/70 | Ht 71.0 in | Wt 290.0 lb

## 2011-10-16 DIAGNOSIS — M1710 Unilateral primary osteoarthritis, unspecified knee: Secondary | ICD-10-CM

## 2011-10-16 NOTE — Telephone Encounter (Signed)
No note, error

## 2011-10-16 NOTE — Progress Notes (Signed)
Patient ID: Parker Rodriguez, male   DOB: [birth year], 51 y.o.   MRN: [id number] Chief Complaint  Patient presents with  . Knee Pain    bilateral knee pain   The patient was previously evaluated for his LEFT knee and has osteoarthritis and was advised to have surgery.  He also has chronic lower back pain with degenerative disc disease  He now complains of pain in his RIGHT knee for one year no injury.  Sharp throbbing stabbing burning 10 out of 10 constant pain morning and night worse with standing for long periods of time sitting for too long a time walking or lying down for long periods.  It is concerning that he has numbness and tingling which runs from his knee down into his foot.  He has locking and catching of both knees and bilateral swelling.  He is losing the ability to do some of his activities of daily living he is having difficulty with standing climbing and getting out of a chair.  He has not used an assistive device at this time he was treated with injection in the LEFT knee  He was treated with hydrocodone for pain  He indicates he can no longer stand the pain in his LEFT knee and wishes to eventually get surgery done in July.  System review he's gained weight he has blurred vision and redness and watering of his eyes.  He has a history of a heart murmur has some wheezing in his chest as well as heartburn and nausea.  His skin itches that time she's had a history of a seizure anxiety depression excessive thirst and urination as well as seasonal ALLERGIES.  His family physician is Dr. Sudie Bailey  Past Medical History  Diagnosis Date  . Allergic rhinitis   . Hypertension     benign essentia  . Carpal tunnel syndrome   . Intervertebral disc disorder   . DM (diabetes mellitus)     family hx  . Obesity   . Peptic ulcer     remote past  . Hypercholesterolemia   . Asthma   . Depressive reaction   . Paroxysmal vertigo   . Anxiety   . Arthritis     Past Surgical History    Procedure Date  . Hand surgery     left  . Colonoscopy 09/11/2011    Procedure: COLONOSCOPY;  Surgeon: Corbin Ade, MD;  Location: [facility];  Service: Endoscopy;  Laterality: N/A;  9:15    The patient has a family history of  Current outpatient prescriptions:albuterol (PROVENTIL) (2.5 MG/3ML) 0.083% nebulizer solution, Take 2.5 mg by nebulization every 4 (four) hours as needed. , Disp: , Rfl: ;  beclomethasone (QVAR) 80 MCG/ACT inhaler, Inhale 2 puffs into the lungs 2 (two) times daily as needed., Disp: , Rfl: ;  citalopram (CELEXA) 20 MG tablet, Take 20 mg by mouth daily. , Disp: , Rfl:  fluticasone (FLONASE) 50 MCG/ACT nasal spray, Place 1 spray into the nose daily., Disp: , Rfl: ;  HYDROcodone-acetaminophen (LORCET) 10-650 MG per tablet, Take 1 tablet by mouth every 4 (four) hours as needed. pain, Disp: , Rfl: ;  ibuprofen (ADVIL,MOTRIN) 800 MG tablet, Take 800 mg by mouth every 8 (eight) hours as needed. Pain/inflammation, Disp: , Rfl: ;  NEXIUM 40 MG capsule, Take 40 mg by mouth daily before breakfast. , Disp: , Rfl:  peg 3350 powder (MOVIPREP) 100 G SOLR, Take 1 kit (100 g total) by mouth once. As directed Please purchase  1 Fleets enema to use with the prep, Disp: 1 kit, Rfl: 0;  potassium chloride SA (K-DUR,KLOR-CON) 20 MEQ tablet, Take 20 mEq by mouth daily. , Disp: , Rfl: ;  valsartan-hydrochlorothiazide (DIOVAN-HCT) 160-12.5 MG per tablet, Take 1 tablet by mouth daily. , Disp: , Rfl:   Vital signs are stable as recorded  General appearance is normal, He does exhibit obesity.  No gross deformities.  The patient is alert and oriented x3  The patient's mood and affect are normal  Gait assessment: Ambulation is notable for external rotation of the LEFT lower extremity and limping. The cardiovascular exam reveals normal pulses and temperature without edema swelling.  The lymphatic system is negative for palpable lymph nodes  The sensory exam is normal.  There are no  pathologic reflexes.  Balance is normal.   Exam of the RIGHT knee Inspection There is a small joint effusion with medial joint line tenderness which is quite severe Range of motion 5 flexion contracture 115 of flexion Stability Stable Strength Normal Skin Normal   Exam of the LEFT knee  Inspection there is a larger joint effusion.  There is tenderness quite severe in the medial joint line.  Range of motion 5 flexion contracture 110 of flexion  Stability stable, strength normal, skin normal  Repeat x-rays were done [facility] of both knees and lateral and patellar view RIGHT knee  The x-rays show that he does have moderate arthritis on x-ray.  His x-rays in some ways are not as significant as his symptoms.  We discussed this  He indicates he does not have any back pain at this time but he does have pain in the backs of his knees and the numbness and tingling are obviously related.  He wishes to have surgery in the LEFT knee and July after his vacation he will call us back or that the schedule.  He will need a repeat x-ray of his LEFT knee as his last one was in August of 2011.  Preoperative teaching and consent was done including patient let her trip.  The patient was asked to go to the joint replacement glasses.  Impression bilateral knee pain from osteoarthritis underlying degenerative disc disease and back pain somewhat contributing to posterior knee pain and numbness and tingling.  Separately identifiable x-ray report [facility] and lateral and patellar view of the RIGHT knee and [facility] of the LEFT knee  There is bilateral disease primarily medial compartment.  The patient does have decent remaining joint space.  There are surrounding osteophytes there is subchondral sclerosis.  Impression moderate arthritis.  Knee  Injection Procedure Note  Pre-operative Diagnosis: left knee oa  Post-operative Diagnosis: same  Indications: pain  Anesthesia: ethyl chloride   Procedure Details    Verbal consent was obtained for the procedure. Time out was completed.The joint was prepped with alcohol, followed by  Ethyl chloride spray and A 20 gauge needle was inserted into the knee via lateral approach; 4ml 1% lidocaine and 1 ml of depomedrol  was then injected into the joint . The needle was removed and the area cleansed and dressed.  Complications:  None; patient tolerated the procedure well.

## 2011-10-16 NOTE — Telephone Encounter (Signed)
Parker Rodriguez asked if you will call in a prescription for Vicodin to [facility].  He said it had been about a year since he Had a prescription.

## 2011-10-16 NOTE — Patient Instructions (Addendum)
You have received a steroid shot. 15% of patients experience increased pain at the injection site with in the next 24 hours. This is best treated with ice and tylenol extra strength 2 tabs every 8 hours. If you are still having pain please call the office.   Please go to the joint class   Surgery will be in July or early August, call us when you come back from your vacation  You have been scheduled for surgery.  All surgeries carry some risk.  Remember you always have the option of continued nonsurgical treatment. However in this situation the risks vs. the benefits favor surgery as the best treatment option. The risks of the surgery includes the following but is not limited to bleeding, infection, pulmonary embolus, death from anesthesia, nerve injury vascular injury or need for further surgery, continued pain.  Specific to this procedure the following risks and complications are rare but possible Stiffness, pain, infection I expect 3-5 months to fully recover from this procedure.   Preparing for Knee Replacement Recovery from knee replacement surgery can be made easier and more comfortable by taking steps to be prepared before surgery. This includes:  Arranging for others to help you.   Preparing your home.   Making sure your body is prepared by doing a pre-operative exam and being as healthy as you can.   Doing exercises before your surgery as told by your caregiver.  Also, you can ease any concerns about your financial responsibilities by calling your insurance company after you decide to have surgery. In addition to your surgery and hospital stay, you will want to ask about your coverage for medical equipment, rehab facilities, and home care. ARRANGING FOR HELP   You will be getting stronger and more mobile every day. However, in the first couple weeks after surgery, it is unlikely you will be able to do all your daily activities as easily as before your surgery. You will tire easily  and still have limited movement in your leg. Follow these guidelines to best arrange for the help you may need after your surgery:  Arrange for someone to drive you home from the hospital. Your surgeon will be able to tell you how many days you can expect to be in the hospital.   Cancel all work, care-giving, and volunteer responsibilities for at least 4 to 6 weeks after your surgery.   If you live alone, arrange for someone to care for your home and pets for the first 4 to 6 weeks.   Select someone with whom you feel comfortable to be with you day and night for the first week. This person will help you with your exercises and personal care, like bathing and using the toilet.   Arrange for drivers to bring you to and from your doctor and therapy appointments, as well as to the grocery store and other places you need to go, for at least 4 to 6 weeks.   Select 2 or 3 rehab facilities where you would be comfortable recovering just in case you are not able to go directly home to recover.  PREPARING YOUR HOME  Remove all clutter from your floors.   To see if you will be able to move in these spaces with a wheeled walker, hold your hands out about 6 inches from your sides. Then walk from your bed to the bathroom. Walk from your resting spot to your kitchen and bathroom. If you do not hit anything with your hands, you  probably have enough room.   Remove throw rugs.   Move the items you use often to shelves and drawers that are at countertop height.  Move items in your bathroom, kitchen, and bedroom.   Prepare a few meals that you can freeze and reheat later.   Do not plan on recovering in bed.  It is better for your health to sit upright. You may wish to use a recliner with a small table nearby. Keep the items you use most frequently on that table. These may include the TV remote, a cordless phone, a book or laptop computer, water glass, and any other items of your choice.   Consider adding grab  bars in the shower and near the toilet.   While you are in the hospital, you will learn about equipment helpful for your recovery. Some of the equipment includes raised toilet seats, tub benches, and shower benches. Often, your hospital care team will help you decide what you need and can direct you about where to buy these items. You may not need all of these items, and they are not often returnable, so it is not recommended that you buy them before going to the hospital.  PREPARING YOUR BODY  Complete a pre-operative exam. This will ensure that your body is healthy enough to safely complete this surgery. Be certain to bring a complete list of all your medicines and supplements (herbs and vitamins). You may be advised to have additional tests to ensure your safety.   Complete elective dental care and routine cleanings before the surgery. Germs from anywhere in your body, including those in your mouth, can travel to your new joint and infect it.  It is important not to have any dental work performed for at least 3 months after your surgery. After surgery, be sure to tell your dentist about your joint replacement.   Maintain a healthy diet. Unless advised by your surgeon, do not drastically change your diet before your surgery.   Quit smoking. Get help from your caregiver if you need it.   The day before your surgery, follow your surgeon's directions for showering, eating and drinking. These directions are for your safety.  EXERCISES Your caregiver may have you do the following exercises before your surgery. Be sure to follow the exercise program your caregiver prescribes for you. Doing the exercises on both sides will help prepare your "good" side as well. While completing these exercises, remember:    Stretch as long as you can, up to 30 seconds, without pain developing.   You should only feel a gentle lengthening or release in the stretched tissue.  Ankle Pumps 1. While sitting with your knees  straight, draw the top of your feet upwards by flexing your ankles.   2. Then, reverse the motion, pointing your toes downward.  3. Repeat 10 to 20 times. Complete this exercise 1 to 2 times per day.  Heel Slides 1. Lie on your back with both knees straight. (If this causes back discomfort, bend your opposite knee, placing your foot flat on the floor.)  2. Slowly slide your heel back toward your buttocks until you feel a gentle stretch in the front of your knee or thigh.  3. Slowly slide your heel back to the starting position.  4. Repeat 10 to 20 times. Complete this exercise 1 to 2 times per day.  Quadriceps Sets 1. Lie on your back with your sore leg extended and your opposite knee bent.  2. Gradually  tense the muscles in the front of your thigh. You should see either your kneecap slide up toward your hip or increased dimpling just above the knee. This motion will push the back of the knee down toward the floor.   3. Hold the muscle as tight as you can without increasing your pain for 10 seconds.  4. Relax the muscles slowly and completely in between each repetition.  5. Repeat 10 to 20 times. Complete this exercise 1 to 2 times per day.  Short Arc Kicks 1. Lie on your back. Place a 4 to 6 inch towel roll under your sore knee so that the knee slightly bends.  2. Raise only your lower leg by tightening the muscles in the front of your thigh. Do not allow your thigh to rise.  3. Hold this position for 5 seconds.  4. Repeat 10 to 20 times. Complete this exercise 1 to 2 times per day.  Straight Leg Raises 1. Lie on your back with your sore leg extended and your opposite knee bent.   2. Tense the muscles in the front of your sore thigh. You should see either your kneecap slide up or increased dimpling just above the knee. Your thigh may even quiver.  3. Tighten these muscles even more and raise your leg 4 to 6 inches off the floor. Hold for 3 to 5 seconds.  4. Keeping these muscles tense, lower  your leg.  5. Relax the muscles slowly and completely in between each repetition.  6. Repeat 10 to 20 times. Complete this exercise 1 to 2 times per day.  Arm Chair Push-ups 1. Find a firm, non-wheeled chair with solid armrests.  2. Sitting in the chair, extend your sore leg straight out in front of you.  3. Lift up your body weight, using your arms and opposite leg.  4. Slowly lower your body weight.   5. Repeat 10 to 20 times. Complete this exercise 1 to 2 times per day.  Document Released: 10/11/2010 Document Revised: 06/26/2011 Document Reviewed: 10/11/2010 [facility] Patient Information 2012 [place], [place].  Total Knee Replacement In total knee replacement, the damaged knee is replaced with an artificial knee joint (prosthesis). The purpose of this surgery is to reduce pain and improve your range of motion. Regaining a near normal range of motion of your knee in the first 3 to 6 weeks after surgery is critical. Generally, these artificial joints last a minimum of 10 years. By that time, about 1 in 10 patients will need another surgery to repair the loose prosthesis. LET YOUR CAREGIVER KNOW ABOUT:    Allergies to food or medicine.   Medicines taken, including vitamins, herbs, eyedrops, over-the-counter medicines, and creams.   Use of steroids (by mouth or creams).   Previous problems with anesthetics or numbing medicines.   History of bleeding problems or blood clots.   Previous surgery.   Other health problems, including diabetes and kidney problems.   Possibility of pregnancy, if this applies.  RISKS AND COMPLICATIONS    Knee pain.   Loss of range of motion of the knee (stiffness) or instability.   Loosening of the prosthesis.   Infection.  BEFORE THE PROCEDURE    If you smoke, quit.   You may need a replacement or addition of blood (transfusion) during this procedure. You may want to donate your own blood for storage several weeks before the procedure. This way, your  own blood can be stored and given to you if needed.  Talk to your surgeon about this option.   Do not eat or drink anything for as long as directed by your caregiver before the procedure.   You may bathe and brush your teeth before the procedure. Do not swallow the water when brushing your teeth.   Scrub the area to be operated on for 5 minutes in the morning before the procedure. Use special soap if you are directed to do so by your caregiver.   Take your regular medicines with a small sip of water unless otherwise instructed. Your caregiver will let you know if there are medicines that need to be stopped and for how long.   You should be present 60 minutes before your procedure or as directed by your caregiver.  PROCEDURE   Before surgery, an intravenous (IV) access for giving fluids will be started. You will be given medicines and/or gas to make you sleep (anesthetic). You may be given medicines in your back with a needle to make you numb from the waist down. Your surgeon will take out any damaged cartilage and bone. He or she will then put in new metal, plastic, or ceramic joint surfaces to restore alignment and function to your knee. AFTER THE PROCEDURE   You will be taken to the recovery area where a nurse will watch and check your progress. You may have a long, narrow tube (catheter) in your bladder after surgery. The catheter helps you empty your bladder (pass your urine). You may have drainage tubes coming out from under the dressing. These tubes attach to a device that removes blood or fluids that gather after surgery. Once you are awake, stable, and taking fluids well, you will be returned to your room. You will receive physical therapy as prescribed by your caregiver. If you do not have help at home, you may need to go to an extended care facility for a few weeks. If you are sent home with a continuous passive motion (CPM) machine, use it as instructed. Document Released: 10/13/2000  Document Revised: 06/26/2011 Document Reviewed: 05/09/2009 [facility] Patient Information 2012 [place], [place].

## 2011-10-21 NOTE — Telephone Encounter (Signed)
Cant at this time out of town

## 2011-10-27 NOTE — Telephone Encounter (Signed)
Sorry, I understood that you wanted these sent to you while out of town.  Please address  the request now that you are back.

## 2011-10-27 NOTE — Telephone Encounter (Signed)
No we can not prescribe any medication

## 2011-10-28 ENCOUNTER — Encounter (HOSPITAL_COMMUNITY): Payer: Self-pay

## 2011-10-28 ENCOUNTER — Emergency Department (HOSPITAL_COMMUNITY): Payer: Medicare Other

## 2011-10-28 ENCOUNTER — Emergency Department (HOSPITAL_COMMUNITY)
Admission: EM | Admit: 2011-10-28 | Discharge: 2011-10-28 | Disposition: A | Discharge status: Home / Self Care | Payer: Medicare Other | Attending: Emergency Medicine | Admitting: Emergency Medicine

## 2011-10-28 DIAGNOSIS — J45909 Unspecified asthma, uncomplicated: Secondary | ICD-10-CM | POA: Insufficient documentation

## 2011-10-28 DIAGNOSIS — I10 Essential (primary) hypertension: Secondary | ICD-10-CM | POA: Insufficient documentation

## 2011-10-28 DIAGNOSIS — J069 Acute upper respiratory infection, unspecified: Secondary | ICD-10-CM

## 2011-10-28 DIAGNOSIS — E119 Type 2 diabetes mellitus without complications: Secondary | ICD-10-CM | POA: Insufficient documentation

## 2011-10-28 DIAGNOSIS — J9801 Acute bronchospasm: Secondary | ICD-10-CM

## 2011-10-28 MED ORDER — PREDNISONE 20 MG PO TABS: 40.0000 mg | ORAL_TABLET | Freq: Every day | ORAL | Status: AC

## 2011-10-28 MED ORDER — PREDNISONE 20 MG PO TABS
60.0000 mg | ORAL_TABLET | Freq: Once | ORAL | Status: AC
  Administered 2011-10-28: 60 mg via ORAL
  Filled 2011-10-28: qty 3

## 2011-10-28 MED ORDER — ALBUTEROL SULFATE HFA 108 (90 BASE) MCG/ACT IN AERS
2.0000 | INHALATION_SPRAY | RESPIRATORY_TRACT | Status: DC | PRN
  Administered 2011-10-28: 2 via RESPIRATORY_TRACT
  Filled 2011-10-28: qty 6.7

## 2011-10-28 MED ORDER — ALBUTEROL SULFATE HFA 108 (90 BASE) MCG/ACT IN AERS: 2.0000 | INHALATION_SPRAY | RESPIRATORY_TRACT | Status: DC | PRN

## 2011-10-28 MED ORDER — OXYMETAZOLINE HCL 0.05 % NA SOLN: 2.0000 | Freq: Two times a day (BID) | NASAL | Status: AC | PRN

## 2011-10-28 NOTE — ED Provider Notes (Signed)
History     CSN: [id number]  Arrival date & time 10/28/11  1208   First MD Initiated Contact with Patient 10/28/11 1310      Chief Complaint  Patient presents with  . URI    (Consider location/radiation/quality/duration/timing/severity/associated sxs/prior treatment) Patient is a 51 y.o. male presenting with URI. The history is provided by the patient.  URI The primary symptoms include fever (subjective fever only), fatigue, ear pain, sore throat, swollen glands, cough and wheezing. Primary symptoms do not include headaches, abdominal pain, nausea, vomiting, myalgias, arthralgias or rash. The current episode started 3 to 5 days ago. This is a new problem. The problem has not changed since onset. The fever began 2 days ago. The fever has been resolved since its onset. The maximum temperature recorded prior to his arrival was unknown. Temperature source: subjective fever only.  The fatigue began 3 to 5 days ago. The fatigue has been unchanged since its onset. The fatigue is worsened by nothing.  The ear pain began 2 days ago. Ear pain is a new problem. The ear pain has been unchanged since its onset. Both ears are affected.  The sore throat began more than 2 days ago. The sore throat has been unchanged since its onset. The sore throat is mild in intensity. The sore throat is not accompanied by trouble swallowing, drooling, hoarse voice or stridor.  The swelling is not associated with trouble swallowing.  The cough began 3 to 5 days ago. The cough is new. The cough is productive. The sputum is yellow.  Wheezing began more than 2 days ago. Wheezing occurs intermittently. The wheezing has been unchanged since its onset. Precipitants: upper respiratory symptoms. The patient's medical history is significant for asthma. The patient's medical history does not include COPD.  Symptoms associated with the illness include chills, plugged ear sensation, congestion and rhinorrhea. The illness is not  associated with facial pain or sinus pressure.    Past Medical History  Diagnosis Date  . Allergic rhinitis   . Hypertension     benign essentia  . Carpal tunnel syndrome   . Intervertebral disc disorder   . DM (diabetes mellitus)     family hx  . Obesity   . Peptic ulcer     remote past  . Hypercholesterolemia   . Asthma   . Depressive reaction   . Paroxysmal vertigo   . Anxiety   . Arthritis     Past Surgical History  Procedure Date  . Hand surgery     left  . Colonoscopy 09/11/2011    Procedure: COLONOSCOPY;  Surgeon: Corbin Ade, MD;  Location: [facility];  Service: Endoscopy;  Laterality: N/A;  9:15    Family History  Problem Relation Age of Onset  . Colon cancer Paternal Uncle   . Pneumonia Father     died at age 56  . Heart disease    . Asthma    . Diabetes      History  Substance Use Topics  . Smoking status: Never Smoker   . Smokeless tobacco: Not on file  . Alcohol Use: Yes     glass of wine every now and then      Review of Systems  Constitutional: Positive for fever (subjective fever only), chills and fatigue.  HENT: Positive for ear pain, congestion, sore throat and rhinorrhea. Negative for hoarse voice, drooling, trouble swallowing and sinus pressure.   Respiratory: Positive for cough and wheezing. Negative for stridor.  Gastrointestinal: Negative for nausea, vomiting and abdominal pain.  Musculoskeletal: Negative for myalgias and arthralgias.  Skin: Negative for rash.  Neurological: Negative for headaches.  All other systems reviewed and are negative.    Allergies  Latex  Home Medications   Current Outpatient Rx  Name Route Sig Dispense Refill  . ALBUTEROL SULFATE (2.5 MG/3ML) 0.083% IN NEBU Nebulization Take 2.5 mg by nebulization every 4 (four) hours as needed.     . BECLOMETHASONE DIPROPIONATE 80 MCG/ACT IN AERS Inhalation Inhale 2 puffs into the lungs 2 (two) times daily as needed.    Marland Kitchen CITALOPRAM HYDROBROMIDE 20 MG PO  TABS Oral Take 20 mg by mouth daily.     Marland Kitchen FLUTICASONE PROPIONATE 50 MCG/ACT NA SUSP Nasal Place 1 spray into the nose daily.    Marland Kitchen HYDROCODONE-ACETAMINOPHEN 10-650 MG PO TABS Oral Take 1 tablet by mouth every 4 (four) hours as needed. pain    . IBUPROFEN 800 MG PO TABS Oral Take 800 mg by mouth every 8 (eight) hours as needed. Pain/inflammation    . NEXIUM 40 MG PO CPDR Oral Take 40 mg by mouth daily before breakfast.     . POTASSIUM CHLORIDE CRYS ER 20 MEQ PO TBCR Oral Take 20 mEq by mouth daily.     Marland Kitchen VALSARTAN-HYDROCHLOROTHIAZIDE 160-12.5 MG PO TABS Oral Take 1 tablet by mouth daily.       BP 129/70  Pulse 77  Temp(Src) 98.1 F (36.7 C) (Oral)  Resp 20  Ht 5\' 11"  (1.803 m)  Wt 275 lb (124.739 kg)  BMI 38.35 kg/m2  SpO2 98%  Physical Exam  Nursing note and vitals reviewed. Constitutional: He is oriented to person, place, and time. He appears well-developed and well-nourished. No distress.  HENT:  Head: Normocephalic and atraumatic. Head is without raccoon's eyes, without Battle's sign, without right periorbital erythema and without left periorbital erythema.  Right Ear: Hearing, tympanic membrane, external ear and ear canal normal. Tympanic membrane is not injected.  Left Ear: Hearing, tympanic membrane, external ear and ear canal normal. Tympanic membrane is not injected.  Nose: Mucosal edema and rhinorrhea present. Right sinus exhibits no maxillary sinus tenderness and no frontal sinus tenderness. Left sinus exhibits no maxillary sinus tenderness and no frontal sinus tenderness.  Mouth/Throat: Uvula is midline and oropharynx is clear and moist. Mucous membranes are not dry. No oropharyngeal exudate, posterior oropharyngeal edema, posterior oropharyngeal erythema or tonsillar abscesses.  Eyes: Conjunctivae and EOM are normal.  Neck: Normal range of motion. Neck supple. No JVD present. No tracheal deviation present.  Cardiovascular: Normal rate, regular rhythm, normal heart sounds and  intact distal pulses.  Exam reveals no gallop and no friction rub.   No murmur heard. Pulmonary/Chest: Effort normal and breath sounds normal. No respiratory distress. He has no wheezes. He has no rales. He exhibits no tenderness.  Abdominal: Soft. Bowel sounds are normal. He exhibits no distension. There is no tenderness. There is no rebound and no guarding.  Musculoskeletal: Normal range of motion. He exhibits no edema and no tenderness.  Lymphadenopathy:    He has cervical adenopathy.  Neurological: He is alert and oriented to person, place, and time. He has normal reflexes. No cranial nerve deficit. He exhibits normal muscle tone. Coordination normal.  Skin: Skin is warm and dry. No rash noted. He is not diaphoretic. No erythema. No pallor.  Psychiatric: He has a normal mood and affect. His behavior is normal. Judgment and thought content normal.    ED Course  Procedures (including critical care time)  Labs Reviewed - No data to display Dg Chest 2 View  10/28/2011  *RADIOLOGY REPORT*  Clinical Data: Upper respiratory infection, cough and congestion, asthma hypertension  CHEST - 2 VIEW  Comparison: Dec 02, 2010  Findings: The cardiac silhouette, mediastinum, pulmonary vasculature are within normal limits.  Both lungs are clear. There is no acute bony abnormality.  IMPRESSION: There is no evidence of acute cardiac or pulmonary process.  Original Report Authenticated By: Brandon Melnick, M.D.     No diagnosis found.    MDM  The patient has an apparent upper respiratory tract infection, likely viral, with nasal congestion, rhinorrhea, sore throat, plugged ear sensation, ear pain, general malaise, chills and subjective fever with a cough and wheezing relieved by using his inhalers. He has a history of asthma. He is not a smoker and does not have COPD. Symptoms have been going on for approximately 5 days. His chest x-ray and his lung exam do not demonstrate pneumonia. I will treat him for a  viral upper respiratory tract infection and superimposed bronchospasm due to asthma with oral corticosteroid x5 days, decongestant, and albuterol inhaler. The patient states his understanding of and agreement with the plan of care.        Felisa Bonier, MD 10/28/11 1447

## 2011-10-28 NOTE — Discharge Instructions (Signed)
Acute Bronchitis You have acute bronchitis. This means you have a chest cold. The airways in your lungs are red and sore (inflamed). Acute means it is sudden onset.  CAUSES Bronchitis is most often caused by the same virus that causes a cold. SYMPTOMS   Body aches.   Chest congestion.   Chills.   Cough.   Fever.   Shortness of breath.   Sore throat.  TREATMENT  Acute bronchitis is usually treated with rest, fluids, and medicines for relief of fever or cough. Most symptoms should go away after a few days or a week. Increased fluids may help thin your secretions and will prevent dehydration. Your caregiver may give you an inhaler to improve your symptoms. The inhaler reduces shortness of breath and helps control cough. You can take over-the-counter pain relievers or cough medicine to decrease coughing, pain, or fever. A cool-air vaporizer may help thin bronchial secretions and make it easier to clear your chest. Antibiotics are usually not needed but can be prescribed if you smoke, are seriously ill, have chronic lung problems, are elderly, or you are at higher risk for developing complications.Allergies and asthma can make bronchitis worse. Repeated episodes of bronchitis may cause longstanding lung problems. Avoid smoking and secondhand smoke.Exposure to cigarette smoke or irritating chemicals will make bronchitis worse. If you are a cigarette smoker, consider using nicotine gum or skin patches to help control withdrawal symptoms. Quitting smoking will help your lungs heal faster. Recovery from bronchitis is often slow, but you should start feeling better after 2 to 3 days. Cough from bronchitis frequently lasts for 3 to 4 weeks. To prevent another bout of acute bronchitis:  Quit smoking.   Wash your hands frequently to get rid of viruses or use a hand sanitizer.   Avoid other people with cold or virus symptoms.   Try not to touch your hands to your mouth, nose, or eyes.  SEEK  IMMEDIATE MEDICAL CARE IF:  You develop increased fever, chills, or chest pain.   You have severe shortness of breath or bloody sputum.   You develop dehydration, fainting, repeated vomiting, or a severe headache.   You have no improvement after 1 week of treatment or you get worse.  MAKE SURE YOU:   Understand these instructions.   Will watch your condition.   Will get help right away if you are not doing well or get worse.  Document Released: 08/14/2004 Document Revised: 06/26/2011 Document Reviewed: 10/30/2010 [facility] Patient Information 2012 [place], [place].Antibiotic Nonuse  Your caregiver felt that the infection or problem was not one that would be helped with an antibiotic. Infections may be caused by viruses or bacteria. Only a caregiver can tell which one of these is the likely cause of an illness. A cold is the most common cause of infection in both adults and children. A cold is a virus. Antibiotic treatment will have no effect on a viral infection. Viruses can lead to many lost days of work caring for sick children and many missed days of school. Children may catch as many as 10 "colds" or "flus" per year during which they can be tearful, cranky, and uncomfortable. The goal of treating a virus is aimed at keeping the ill person comfortable. Antibiotics are medications used to help the body fight bacterial infections. There are relatively few types of bacteria that cause infections but there are hundreds of viruses. While both viruses and bacteria cause infection they are very different types of germs. A viral infection  will typically go away by itself within 7 to 10 days. Bacterial infections may spread or get worse without antibiotic treatment. Examples of bacterial infections are:  Sore throats (like strep throat or tonsillitis).   Infection in the lung (pneumonia).   Ear and skin infections.  Examples of viral infections are:  Colds or flus.   Most coughs and  bronchitis.   Sore throats not caused by Strep.   Runny noses.  It is often best not to take an antibiotic when a viral infection is the cause of the problem. Antibiotics can kill off the helpful bacteria that we have inside our body and allow harmful bacteria to start growing. Antibiotics can cause side effects such as allergies, nausea, and diarrhea without helping to improve the symptoms of the viral infection. Additionally, repeated uses of antibiotics can cause bacteria inside of our body to become resistant. That resistance can be passed onto harmful bacterial. The next time you have an infection it may be harder to treat if antibiotics are used when they are not needed. Not treating with antibiotics allows our own immune system to develop and take care of infections more efficiently. Also, antibiotics will work better for [place] when they are prescribed for bacterial infections. Treatments for a child that is ill may include:  Give extra fluids throughout the day to stay hydrated.   Get plenty of rest.   Only give your child over-the-counter or prescription medicines for pain, discomfort, or fever as directed by your caregiver.   The use of a cool mist humidifier may help stuffy noses.   Cold medications if suggested by your caregiver.  Your caregiver may decide to start you on an antibiotic if:  The problem you were seen for today continues for a longer length of time than expected.   You develop a secondary bacterial infection.  SEEK MEDICAL CARE IF:  Fever lasts longer than 5 days.   Symptoms continue to get worse after 5 to 7 days or become severe.   Difficulty in breathing develops.   Signs of dehydration develop (poor drinking, rare urinating, dark colored urine).   Changes in behavior or worsening tiredness (listlessness or lethargy).  Document Released: 09/15/2001 Document Revised: 06/26/2011 Document Reviewed: 03/14/2009 [facility] Patient Information 2012 [place],  [place].Asthma, Acute Bronchospasm Your exam shows you have asthma, or acute bronchospasm that acts like asthma. Bronchospasm means your air passages become narrowed. These conditions are due to inflammation and airway spasm that cause narrowing of the bronchial tubes in the lungs. This causes you to have wheezing and shortness of breath. CAUSES  Respiratory infections and allergies most often bring on these attacks. Smoking, air pollution, cold air, emotional upsets, and vigorous exercise can also bring them on.  TREATMENT   Treatment is aimed at making the narrowed airways larger. Mild asthma/bronchospasm is usually controlled with inhaled medicines. Albuterol is a common medicine that you breathe in to open spastic or narrowed airways. Some trade names for albuterol are Ventolin or Proventil. Steroid medicine is also used to reduce the inflammation when an attack is moderate or severe. Antibiotics (medications used to kill germs) are only used if a bacterial infection is present.   If you are pregnant and need to use Albuterol (Ventolin or Proventil), you can expect the baby to move more than usual shortly after the medicine is used.  HOME CARE INSTRUCTIONS   Rest.   Drink plenty of liquids. This helps the mucus to remain thin and easily coughed up.  Do not use caffeine or alcohol.   Do not smoke. Avoid being exposed to second-hand smoke.   You play a critical role in keeping yourself in good health. Avoid exposure to things that cause you to wheeze. Avoid exposure to things that cause you to have breathing problems. Keep your medications up-to-date and available. Carefully follow your doctor's treatment plan.   When pollen or pollution is bad, keep windows closed and use an air conditioner go to places with air conditioning. If you are allergic to furry pets or birds, find new homes for them or keep them outside.   Take your medicine exactly as prescribed.   Asthma requires careful medical  attention. See your caregiver for follow-up as advised. If you are more than [redacted] weeks pregnant and you were prescribed any new medications, let your Obstetrician know about the visit and how you are doing. Arrange a recheck.  SEEK IMMEDIATE MEDICAL CARE IF:   You are getting worse.   You have trouble breathing. If severe, call 911.   You develop chest pain or discomfort.   You are throwing up or not drinking fluids.   You are not getting better within 24 hours.   You are coughing up yellow, green, brown, or bloody sputum.   You develop a fever over 102 F (38.9 C).   You have trouble swallowing.  MAKE SURE YOU:   Understand these instructions.   Will watch your condition.   Will get help right away if you are not doing well or get worse.  Document Released: 10/22/2006 Document Revised: 06/26/2011 Document Reviewed: 06/21/2007 [facility] Patient Information 2012 [place], [place].Cool Mist Vaporizers Vaporizers may help relieve the symptoms of a cough and cold. By adding water to the air, mucus may become thinner and less sticky. This makes it easier to breathe and cough up secretions. Vaporizers have not been proven to show they help with colds. You should not use a vaporizer if you are allergic to mold. Cool mist vaporizers do not cause serious burns like hot mist vaporizers ("steamers"). HOME CARE INSTRUCTIONS  Follow the package instructions for your vaporizer.   Use a vaporizer that holds a large volume of water (1 to 2 gallons [5.7 to 7.5 liters]).   Do not use anything other than distilled water in the vaporizer.   Do not run the vaporizer all of the time. This can cause mold or bacteria to grow in the vaporizer.   Clean the vaporizer after each time you use it.   Clean and dry the vaporizer well before you store it.   Stop using a vaporizer if you develop worsening respiratory symptoms.  Document Released: 04/03/2004 Document Revised: 06/26/2011 Document Reviewed:  03/01/2009 [facility] Patient Information 2012 [place], [place].Saline Nose Drops  To help clear a stuffy nose, put salt water (saline) nose drops in your infant's nose. This helps to loosen the secretions in the nose. Use a bulb syringe to clean the nose out:  Before feeding.   Before putting your infant down for naps.   No more than once every 3 hours to avoid irritating your infant's nostrils.  HOME CARE  Buy nose drops at your local drug store. You can also make nose drops yourself. Mix 1 cup of water with  teaspoon of salt. Stir. Store this mixture at room temperature. Make a new batch daily.   To use the drops:   Put 1 or 2 drops in each side of infant's nose with a clean medicine dropper.  Do not use this dropper for any other medicine.   Squeeze the air out of the suction bulb before inserting it into your infant's nose.   While still squeezing the bulb flat, place the tip of the bulb into a nostril. Let air come back into the bulb. The suction will pull snot out of the nose and into the bulb.   Repeat on other nostril.   Squeeze the bulb several times into a tissue and wash the bulb tip in soapy water. Store the bulb with the tip side down on paper towel.   Use the bulb syringe with only the saline drops to avoid irritating your infant's nostrils.  GET HELP RIGHT AWAY IF:  The snot changes to green or yellow.   The snot gets thicker.   Your infant is 3 months or younger with a rectal temperature of 100.4 F (38 C) or higher.   Your infant is older than 3 months with a rectal temperature of 102 F (38.9 C) or higher.   The stuffy nose lasts 10 days or longer.   There is trouble breathing or feeding.  MAKE SURE YOU:  Understand these instructions.   Will watch your infant's condition.   Will get help right away if your infant is not doing well or gets worse.  Document Released: 05/04/2009 Document Revised: 06/26/2011 Document Reviewed: 05/04/2009 [facility] Patient  Information 2012 [place], [place].

## 2011-10-28 NOTE — Telephone Encounter (Signed)
Advise tylenol or advil for pain control

## 2011-10-28 NOTE — ED Notes (Signed)
Pt reports productive cough with yellowish sputum since Friday.  Reports thinks has had fever.

## 2011-10-28 NOTE — ED Notes (Signed)
Return from x-ray. Request something to drink

## 2011-10-29 NOTE — Telephone Encounter (Signed)
Advised the patient of doctor's reply °

## 2011-12-16 ENCOUNTER — Telehealth: Payer: Self-pay | Admitting: Orthopedic Surgery

## 2011-12-16 NOTE — Telephone Encounter (Signed)
Parker Rodriguez said the Ibuprofen is not helping his knee pain at all.  He is asking for Hydrocodone to take until he has his surgery, which he will schedule after he gets back from vacation. He uses Counselling psychologist.

## 2011-12-17 MED ORDER — TRAMADOL-ACETAMINOPHEN 37.5-325 MG PO TABS
1.0000 | ORAL_TABLET | ORAL | Status: AC | PRN
Start: 2011-12-17 — End: 2011-12-27

## 2011-12-17 NOTE — Telephone Encounter (Signed)
ultracet 1 q 4 prn pain #60  (need to save the hydrocodone for after surgery)

## 2011-12-17 NOTE — Telephone Encounter (Signed)
Patient aware med sent

## 2012-02-21 ENCOUNTER — Encounter (HOSPITAL_COMMUNITY): Payer: Self-pay | Admitting: *Deleted

## 2012-02-21 ENCOUNTER — Emergency Department (HOSPITAL_COMMUNITY)
Admission: EM | Admit: 2012-02-21 | Discharge: 2012-02-21 | Disposition: A | Discharge status: Home / Self Care | Payer: Medicare Other | Attending: Emergency Medicine | Admitting: Emergency Medicine

## 2012-02-21 ENCOUNTER — Emergency Department (HOSPITAL_COMMUNITY): Payer: Medicare Other

## 2012-02-21 DIAGNOSIS — N509 Disorder of male genital organs, unspecified: Secondary | ICD-10-CM | POA: Insufficient documentation

## 2012-02-21 DIAGNOSIS — Z79899 Other long term (current) drug therapy: Secondary | ICD-10-CM | POA: Insufficient documentation

## 2012-02-21 DIAGNOSIS — E119 Type 2 diabetes mellitus without complications: Secondary | ICD-10-CM | POA: Insufficient documentation

## 2012-02-21 DIAGNOSIS — I10 Essential (primary) hypertension: Secondary | ICD-10-CM | POA: Insufficient documentation

## 2012-02-21 DIAGNOSIS — E7800 Pure hypercholesterolemia, unspecified: Secondary | ICD-10-CM | POA: Insufficient documentation

## 2012-02-21 DIAGNOSIS — L03315 Cellulitis of perineum: Secondary | ICD-10-CM

## 2012-02-21 DIAGNOSIS — L02219 Cutaneous abscess of trunk, unspecified: Secondary | ICD-10-CM | POA: Insufficient documentation

## 2012-02-21 LAB — CBC WITH DIFFERENTIAL/PLATELET
Basophils Absolute: 0 10*3/uL (ref 0.0–0.1)
Basophils Relative: 0 % (ref 0–1)
Eosinophils Absolute: 0.4 10*3/uL (ref 0.0–0.7)
HCT: 43.5 % (ref 39.0–52.0)
MCH: 29.5 pg (ref 26.0–34.0)
MCHC: 34.3 g/dL (ref 30.0–36.0)
Monocytes Absolute: 1.6 10*3/uL — ABNORMAL HIGH (ref 0.1–1.0)
Neutro Abs: 11.8 10*3/uL — ABNORMAL HIGH (ref 1.7–7.7)
RDW: 14 % (ref 11.5–15.5)

## 2012-02-21 LAB — URINE MICROSCOPIC-ADD ON

## 2012-02-21 LAB — URINALYSIS, ROUTINE W REFLEX MICROSCOPIC
Bilirubin Urine: NEGATIVE
Ketones, ur: NEGATIVE mg/dL
Nitrite: NEGATIVE
Protein, ur: NEGATIVE mg/dL
Specific Gravity, Urine: >1.03 — ABNORMAL HIGH (ref 1.005–1.030)
Urobilinogen, UA: 2 mg/dL — ABNORMAL HIGH (ref 0.0–1.0)

## 2012-02-21 LAB — BASIC METABOLIC PANEL
BUN: 10 mg/dL (ref 6–23)
Calcium: 9.4 mg/dL (ref 8.4–10.5)
Creatinine, Ser: 1.21 mg/dL (ref 0.50–1.35)
GFR calc Af Amer: 77 mL/min — ABNORMAL LOW (ref >90)
GFR calc non Af Amer: 67 mL/min — ABNORMAL LOW (ref >90)
Glucose, Bld: 112 mg/dL — ABNORMAL HIGH (ref 70–99)

## 2012-02-21 MED ORDER — POLYETHYLENE GLYCOL 3350 17 GM/SCOOP PO POWD: 17.0000 g | Freq: Every day | ORAL | Status: AC

## 2012-02-21 MED ORDER — SODIUM CHLORIDE 0.9 % IV SOLN
3.0000 g | Freq: Once | INTRAVENOUS | Status: AC
  Administered 2012-02-21: 3 g via INTRAVENOUS
  Filled 2012-02-21: qty 3

## 2012-02-21 MED ORDER — AMOXICILLIN-POT CLAVULANATE 875-125 MG PO TABS: 1.0000 | ORAL_TABLET | Freq: Two times a day (BID) | ORAL | Status: AC

## 2012-02-21 MED ORDER — SODIUM CHLORIDE 0.9 % IV BOLUS (SEPSIS)
500.0000 mL | Freq: Once | INTRAVENOUS | Status: AC
  Administered 2012-02-21: 19:00:00 via INTRAVENOUS

## 2012-02-21 MED ORDER — OXYCODONE-ACETAMINOPHEN 5-325 MG PO TABS: 1.0000 | ORAL_TABLET | Freq: Four times a day (QID) | ORAL | Status: AC | PRN

## 2012-02-21 MED ORDER — IOHEXOL 300 MG/ML  SOLN
100.0000 mL | Freq: Once | INTRAMUSCULAR | Status: AC | PRN
  Administered 2012-02-21: 100 mL via INTRAVENOUS

## 2012-02-21 MED ORDER — FENTANYL CITRATE 0.05 MG/ML IJ SOLN
100.0000 ug | Freq: Once | INTRAMUSCULAR | Status: AC
  Administered 2012-02-21: 100 ug via INTRAVENOUS
  Filled 2012-02-21: qty 2

## 2012-02-21 MED ORDER — SODIUM CHLORIDE 0.9 % IV SOLN: INTRAVENOUS | Status: DC

## 2012-02-21 NOTE — ED Notes (Signed)
Drinking gingerale - has been very thirsty

## 2012-02-21 NOTE — ED Provider Notes (Signed)
History   This chart was scribed for [employer]. Rubin Payor, MD by Charolett Bumpers . The patient was seen in room APA10/APA10. Patient's care was started at 1835.    CSN: [id number]  Arrival date & time 02/21/12  1813   First MD Initiated Contact with Patient 02/21/12 1835      Chief Complaint  Patient presents with  . Abscess  . Chills    (Consider location/radiation/quality/duration/timing/severity/associated sxs/prior treatment) HPI Parker Rodriguez is a 51 y.o. male who presents to the Emergency Department complaining of constant, moderate abscess located on his right buttocks with an onset of 2 days ago. Pt states that his symptoms are worsening. Pt reports that he noticed the area Thursday reports associated swelling that started yesterday. Pt states that he may have been bitten by an insect. Pt denies any drainage. Pt reports associated nausea, decreased appetite, fevers and chills. Pt denies any v/d, cough, SOB, or dysuria. Pt denies any h/o abscess. Pt reports a h/o borderline diabetes.   Past Medical History  Diagnosis Date  . Allergic rhinitis   . Hypertension     benign essentia  . Carpal tunnel syndrome   . Intervertebral disc disorder   . DM (diabetes mellitus)     family hx  . Obesity   . Peptic ulcer     remote past  . Hypercholesterolemia   . Asthma   . Depressive reaction   . Paroxysmal vertigo   . Anxiety   . Arthritis     Past Surgical History  Procedure Date  . Hand surgery     left  . Colonoscopy 09/11/2011    Procedure: COLONOSCOPY;  Surgeon: Corbin Ade, MD;  Location: [facility];  Service: Endoscopy;  Laterality: N/A;  9:15    Family History  Problem Relation Age of Onset  . Colon cancer Paternal Uncle   . Pneumonia Father     died at age 35  . Heart disease    . Asthma    . Diabetes      History  Substance Use Topics  . Smoking status: Never Smoker   . Smokeless tobacco: Not on file  . Alcohol Use: Yes     glass of  wine every now and then      Review of Systems  Constitutional: Positive for fever, chills and appetite change.  Respiratory: Negative for cough and shortness of breath.   Gastrointestinal: Positive for nausea. Negative for vomiting and diarrhea.  Genitourinary: Negative for dysuria.  Skin:       Abscess  All other systems reviewed and are negative.    Allergies  Latex  Home Medications   Current Outpatient Rx  Name Route Sig Dispense Refill  . ALBUTEROL SULFATE HFA 108 (90 BASE) MCG/ACT IN AERS Inhalation Inhale 2 puffs into the lungs every 4 (four) hours as needed for wheezing or shortness of breath (cough). 1 Inhaler 0  . ALBUTEROL SULFATE (2.5 MG/3ML) 0.083% IN NEBU Nebulization Take 2.5 mg by nebulization every 4 (four) hours as needed.     . BECLOMETHASONE DIPROPIONATE 80 MCG/ACT IN AERS Inhalation Inhale 2 puffs into the lungs 2 (two) times daily as needed.    Marland Kitchen CITALOPRAM HYDROBROMIDE 20 MG PO TABS Oral Take 20 mg by mouth at bedtime.     Marland Kitchen FLUTICASONE PROPIONATE 50 MCG/ACT NA SUSP Nasal Place 1 spray into the nose daily.    Marland Kitchen HYDROCODONE-ACETAMINOPHEN 10-650 MG PO TABS Oral Take 1 tablet by mouth every  4 (four) hours as needed. pain    . IBUPROFEN 800 MG PO TABS Oral Take 800 mg by mouth every 8 (eight) hours as needed. Pain/inflammation    . JOINT HEALTH MINERAL PO TABS Oral Take 1 tablet by mouth 5 (five) times daily.    Marland Kitchen NEXIUM 40 MG PO CPDR Oral Take 40 mg by mouth daily before breakfast.     . POTASSIUM CHLORIDE CRYS ER 20 MEQ PO TBCR Oral Take 20 mEq by mouth every morning.     Marland Kitchen VALSARTAN-HYDROCHLOROTHIAZIDE 160-12.5 MG PO TABS Oral Take 1 tablet by mouth daily.     . AMOXICILLIN-POT CLAVULANATE 875-125 MG PO TABS Oral Take 1 tablet by mouth 2 (two) times daily. 14 tablet 0  . OXYCODONE-ACETAMINOPHEN 5-325 MG PO TABS Oral Take 1-2 tablets by mouth every 6 (six) hours as needed for pain. 20 tablet 0  . POLYETHYLENE GLYCOL 3350 PO POWD Oral Take 17 g by mouth daily.  255 g 0    BP 138/72  Pulse 101  Temp 98.5 F (36.9 C) (Oral)  Resp 20  Ht 6' (1.829 m)  Wt 277 lb (125.646 kg)  BMI 37.57 kg/m2  SpO2 95%  Physical Exam  Nursing note and vitals reviewed. Constitutional: He is oriented to person, place, and time. He appears well-developed and well-nourished. He does not appear ill. No distress.       Non ill appearing.   HENT:  Head: Normocephalic and atraumatic.  Eyes: EOM are normal. Pupils are equal, round, and reactive to light.  Neck: Neck supple. No tracheal deviation present.  Cardiovascular: Normal rate.   Pulmonary/Chest: Effort normal. No respiratory distress.  Abdominal: Soft. He exhibits no distension.  Musculoskeletal: Normal range of motion. He exhibits no edema.  Neurological: He is alert and oriented to person, place, and time. No sensory deficit.  Skin: Skin is warm and dry.       4 cm perineal mass that is more tender on the right. No erythema or drainage noted. No clear fluctuance noted.   Psychiatric: He has a normal mood and affect. His behavior is normal.    ED Course  Procedures (including critical care time)  DIAGNOSTIC STUDIES: Oxygen Saturation is 99% on room air, normal by my interpretation.    COORDINATION OF CARE:  18:43-Discussed planned course of treatment with the patient, who is agreeable at this time.   18:45-Medication Orders: 0.9% sodium chloride infusion-continuous; sodium chloride 0.9% bolus 500 mL-once.   Results for orders placed during the hospital encounter of 02/21/12  CBC WITH DIFFERENTIAL      Component Value Range   WBC 16.1 (*) 4.0 - 10.5 K/uL   RBC 5.05  4.22 - 5.81 MIL/uL   Hemoglobin 14.9  13.0 - 17.0 g/dL   HCT 16.1  09.6 - 04.5 %   MCV 86.1  78.0 - 100.0 fL   MCH 29.5  26.0 - 34.0 pg   MCHC 34.3  30.0 - 36.0 g/dL   RDW 40.9  81.1 - 91.4 %   Platelets 211  150 - 400 K/uL   Neutrophils Relative 73  43 - 77 %   Neutro Abs 11.8 (*) 1.7 - 7.7 K/uL   Lymphocytes Relative 14  12 -  46 %   Lymphs Abs 2.2  0.7 - 4.0 K/uL   Monocytes Relative 10  3 - 12 %   Monocytes Absolute 1.6 (*) 0.1 - 1.0 K/uL   Eosinophils Relative 3  0 - 5 %  Eosinophils Absolute 0.4  0.0 - 0.7 K/uL   Basophils Relative 0  0 - 1 %   Basophils Absolute 0.0  0.0 - 0.1 K/uL  URINALYSIS, ROUTINE W REFLEX MICROSCOPIC      Component Value Range   Color, Urine YELLOW  YELLOW   APPearance CLEAR  CLEAR   Specific Gravity, Urine >1.030 (*) 1.005 - 1.030   pH 6.0  5.0 - 8.0   Glucose, UA NEGATIVE  NEGATIVE mg/dL   Hgb urine dipstick SMALL (*) NEGATIVE   Bilirubin Urine NEGATIVE  NEGATIVE   Ketones, ur NEGATIVE  NEGATIVE mg/dL   Protein, ur NEGATIVE  NEGATIVE mg/dL   Urobilinogen, UA 2.0 (*) 0.0 - 1.0 mg/dL   Nitrite NEGATIVE  NEGATIVE   Leukocytes, UA NEGATIVE  NEGATIVE  BASIC METABOLIC PANEL      Component Value Range   Sodium 131 (*) 135 - 145 mEq/L   Potassium 3.0 (*) 3.5 - 5.1 mEq/L   Chloride 90 (*) 96 - 112 mEq/L   CO2 33 (*) 19 - 32 mEq/L   Glucose, Bld 112 (*) 70 - 99 mg/dL   BUN 10  6 - 23 mg/dL   Creatinine, Ser 5.18  0.50 - 1.35 mg/dL   Calcium 9.4  8.4 - 84.1 mg/dL   GFR calc non Af Amer 67 (*) >90 mL/min   GFR calc Af Amer 77 (*) >90 mL/min  URINE MICROSCOPIC-ADD ON      Component Value Range   Squamous Epithelial / LPF RARE  RARE   WBC, UA 3-6  <3 WBC/hpf   RBC / HPF 3-6  <3 RBC/hpf   Bacteria, UA RARE  RARE    Ct Pelvis W Contrast  02/21/2012  *RADIOLOGY REPORT*  Clinical Data:  51 year old male with pain, swelling and redness in the perineal region.  CT PELVIS WITH CONTRAST  Technique:  Multidetector CT imaging of the pelvis was performed using the standard protocol following the bolus administration of intravenous contrast.  Contrast:  100 ml intravenous Omnipaque-300  Comparison:  None  Findings:  There is mild stranding/inflammation in the perineal/medial gluteal subcutaneous tissues without abscess.  The visualized bowel and bladder are unremarkable. Upper limits of  normal of inguinal lymph nodes bilaterally are likely reactive.  Moderate degenerative disc disease and spondylosis at L5-S1 noted. No acute bony abnormalities are identified.  IMPRESSION: Mild perineal/medial gluteal subcutaneous inflammation/cellulitis without abscess.  Original Report Authenticated By: Rosendo Gros, M.D.     1. Cellulitis of perineum       MDM  Patient with perineal pain and tenderness. White count of 16 patient states she's had fevers and chills. CT done to rule out abscess. It shows only a cellulitis. Patient feels somewhat better and will followup with his primary care Dr. Dr. Sudie Bailey was in the ER and the case was discussed with him. We'll see the patient on Monday if he still feels bad if not he'll be seen later in the week.    I personally performed the services described in this documentation, which was scribed in my presence. The recorded information has been reviewed and considered.        Juliet Rude. Rubin Payor, MD 02/21/12 2231

## 2012-02-21 NOTE — ED Notes (Signed)
Pt states large insect bite since Thursday to right buttock. Pt states fever, chills, and decreased appetite also.

## 2012-03-11 ENCOUNTER — Emergency Department (HOSPITAL_COMMUNITY): Payer: Medicare Other

## 2012-03-11 ENCOUNTER — Emergency Department (HOSPITAL_COMMUNITY)
Admission: EM | Admit: 2012-03-11 | Discharge: 2012-03-12 | Disposition: A | Discharge status: Home / Self Care | Payer: Medicare Other | Attending: Emergency Medicine | Admitting: Emergency Medicine

## 2012-03-11 ENCOUNTER — Encounter (HOSPITAL_COMMUNITY): Payer: Self-pay | Admitting: *Deleted

## 2012-03-11 DIAGNOSIS — K859 Acute pancreatitis without necrosis or infection, unspecified: Secondary | ICD-10-CM

## 2012-03-11 DIAGNOSIS — L03317 Cellulitis of buttock: Secondary | ICD-10-CM | POA: Insufficient documentation

## 2012-03-11 DIAGNOSIS — E119 Type 2 diabetes mellitus without complications: Secondary | ICD-10-CM | POA: Insufficient documentation

## 2012-03-11 DIAGNOSIS — I10 Essential (primary) hypertension: Secondary | ICD-10-CM | POA: Insufficient documentation

## 2012-03-11 DIAGNOSIS — L0231 Cutaneous abscess of buttock: Secondary | ICD-10-CM | POA: Insufficient documentation

## 2012-03-11 DIAGNOSIS — E669 Obesity, unspecified: Secondary | ICD-10-CM | POA: Insufficient documentation

## 2012-03-11 DIAGNOSIS — M129 Arthropathy, unspecified: Secondary | ICD-10-CM | POA: Insufficient documentation

## 2012-03-11 DIAGNOSIS — E7800 Pure hypercholesterolemia, unspecified: Secondary | ICD-10-CM | POA: Insufficient documentation

## 2012-03-11 DIAGNOSIS — R109 Unspecified abdominal pain: Secondary | ICD-10-CM

## 2012-03-11 MED ORDER — PANTOPRAZOLE SODIUM 40 MG IV SOLR
40.0000 mg | Freq: Once | INTRAVENOUS | Status: AC
  Administered 2012-03-12: 40 mg via INTRAVENOUS
  Filled 2012-03-11: qty 40

## 2012-03-11 MED ORDER — ONDANSETRON HCL 4 MG/2ML IJ SOLN
INTRAMUSCULAR | Status: AC
  Filled 2012-03-11: qty 2

## 2012-03-11 MED ORDER — SODIUM CHLORIDE 0.9 % IV BOLUS (SEPSIS): 1000.0000 mL | Freq: Once | INTRAVENOUS | Status: DC

## 2012-03-11 MED ORDER — ONDANSETRON HCL 4 MG/2ML IJ SOLN
INTRAMUSCULAR | Status: AC
  Administered 2012-03-11: 4 mg
  Filled 2012-03-11: qty 2

## 2012-03-11 NOTE — ED Provider Notes (Cosign Needed)
History   This chart was scribed for Parker Lennert, MD by Parker Rodriguez. The patient was seen in room APA09/[facility]09 and the patient's care was started at 11:36 PM     CSN: [id number]  Arrival date & time 03/11/12  2154   First MD Initiated Contact with Patient 03/11/12 2334      Chief Complaint  Patient presents with  . Nausea  . Chills  . Wound Check    (Consider location/radiation/quality/duration/timing/severity/associated sxs/prior treatment) Patient is a 51 y.o. male presenting with abscess. The history is provided by the patient. No language interpreter was used.  Abscess  This is a new problem. The current episode started more than one week ago. The onset was gradual. The problem occurs rarely. The problem has been gradually worsening. The abscess is present on the right buttock. The problem is mild. It is unknown what he was exposed to. Associated symptoms include diarrhea and vomiting. Pertinent negatives include no congestion and no cough. The vomiting occurs rarely. The emesis has an appearance of stomach contents. The diarrhea occurs 2 to 4 times per day.    Parker Rodriguez is a 51 y.o. male who presents to the Emergency Department complaining of  gradual, progressively worsening, nausea, onset four weeks ago with associated symptoms of vomiting (multiple times per day), diarrhea, chills, and weight loss. The pt reports the last time he took any antibiotics was yesterday, 03/10/12. The pt has a hx of abscess located on the buttocks (onset four weeks ago).   The pt does not smoke, however, occasionally drinks a glass of wine.   PCP is Dr. Sudie Rodriguez.    Past Medical History  Diagnosis Date  . Allergic rhinitis   . Hypertension     benign essentia  . Carpal tunnel syndrome   . Intervertebral disc disorder   . DM (diabetes mellitus)     family hx  . Obesity   . Peptic ulcer     remote past  . Hypercholesterolemia   . Asthma   . Depressive reaction   . Paroxysmal  vertigo   . Anxiety   . Arthritis     Past Surgical History  Procedure Date  . Hand surgery     left  . Colonoscopy 09/11/2011    Procedure: COLONOSCOPY;  Surgeon: Parker Ade, MD;  Location: [facility];  Service: Endoscopy;  Laterality: N/A;  9:15    Family History  Problem Relation Age of Onset  . Colon cancer Paternal Uncle   . Pneumonia Father     died at age 55  . Heart disease    . Asthma    . Diabetes      History  Substance Use Topics  . Smoking status: Never Smoker   . Smokeless tobacco: Not on file  . Alcohol Use: Yes     glass of wine every now and then      Review of Systems  Constitutional: Negative for fatigue.  HENT: Negative for congestion, sinus pressure and ear discharge.   Eyes: Negative for discharge.  Respiratory: Negative for cough.   Cardiovascular: Negative for chest pain.  Gastrointestinal: Positive for vomiting and diarrhea. Negative for abdominal pain.  Genitourinary: Negative for frequency and hematuria.  Musculoskeletal: Negative for back pain.  Skin: Negative for rash.  Neurological: Negative for seizures and headaches.  Hematological: Negative.   Psychiatric/Behavioral: Negative for hallucinations.    Allergies  Latex  Home Medications   Current Outpatient Rx  Name Route  Sig Dispense Refill  . ALBUTEROL SULFATE HFA 108 (90 BASE) MCG/ACT IN AERS Inhalation Inhale 2 puffs into the lungs every 4 (four) hours as needed for wheezing or shortness of breath (cough). 1 Inhaler 0  . ALBUTEROL SULFATE (2.5 MG/3ML) 0.083% IN NEBU Nebulization Take 2.5 mg by nebulization every 4 (four) hours as needed.     . AMOXICILLIN-POT CLAVULANATE 875-125 MG PO TABS Oral Take 1 tablet by mouth 2 (two) times daily.    . BECLOMETHASONE DIPROPIONATE 80 MCG/ACT IN AERS Inhalation Inhale 2 puffs into the lungs 2 (two) times daily as needed.    Marland Kitchen CIPROFLOXACIN HCL 500 MG PO TABS Oral Take 500 mg by mouth 2 (two) times daily. Prescribed on 02/25/2012. 10  day supply    . CITALOPRAM HYDROBROMIDE 20 MG PO TABS Oral Take 20 mg by mouth at bedtime.     Marland Kitchen DOXYCYCLINE HYCLATE 100 MG PO CAPS Oral Take 100 mg by mouth 2 (two) times daily.    Marland Kitchen HYDROCODONE-ACETAMINOPHEN 10-650 MG PO TABS Oral Take 1 tablet by mouth every 4 (four) hours as needed. pain    . IBUPROFEN 800 MG PO TABS Oral Take 800 mg by mouth every 8 (eight) hours as needed. Pain/inflammation    . JOINT HEALTH MINERAL PO TABS Oral Take 1 tablet by mouth 5 (five) times daily.    Marland Kitchen NEXIUM 40 MG PO CPDR Oral Take 40 mg by mouth daily before breakfast.     . POTASSIUM CHLORIDE CRYS ER 20 MEQ PO TBCR Oral Take 20 mEq by mouth every morning.     . SULFAMETHOXAZOLE-TMP DS 800-160 MG PO TABS Oral Take 1 tablet by mouth 2 (two) times daily.    Marland Kitchen VALSARTAN-HYDROCHLOROTHIAZIDE 160-12.5 MG PO TABS Oral Take 1 tablet by mouth daily.     Marland Kitchen FLUTICASONE PROPIONATE 50 MCG/ACT NA SUSP Nasal Place 1 spray into the nose daily.      BP 106/58  Pulse 89  Temp 97.9 F (36.6 C) (Oral)  Resp 16  Ht 6' (1.829 m)  Wt 272 lb (123.378 kg)  BMI 36.89 kg/m2  SpO2 99%  Physical Exam  Nursing note and vitals reviewed. Constitutional: He is oriented to person, place, and time. He appears well-developed.  HENT:  Head: Normocephalic and atraumatic.  Eyes: Conjunctivae and EOM are normal. No scleral icterus.  Neck: Neck supple. No thyromegaly present.  Cardiovascular: Normal rate and regular rhythm.  Exam reveals no gallop and no friction rub.   No murmur heard. Pulmonary/Chest: No stridor. He has no wheezes. He has no rales. He exhibits no tenderness.  Abdominal: He exhibits no distension. There is tenderness (Mild tenderness throughout abdomen. ). There is no rebound.  Musculoskeletal: Normal range of motion. He exhibits no edema.  Lymphadenopathy:    He has no cervical adenopathy.  Neurological: He is oriented to person, place, and time. Coordination normal.  Skin: No rash noted. No erythema.  Psychiatric:  He has a normal mood and affect. His behavior is normal.    ED Course  Procedures (including critical care time)  DIAGNOSTIC STUDIES: Oxygen Saturation is 99% on room air, normal by my interpretation.    COORDINATION OF CARE:   11:39PM-Blood work discussed. Pt agrees with treatment.    Labs Reviewed - No data to display No results found.   No diagnosis found.    MDM        The chart was scribed for me under my direct supervision.  I personally performed the  history, physical, and medical decision making and all procedures in the evaluation of this patient.Parker Lennert, MD 03/12/12 [phone]

## 2012-03-11 NOTE — ED Notes (Signed)
Pt been sick due to an abscess x 4-5 weeks, last took an antibiotic yesterday and did not take today due to upsetting his stomach too bad per pt, + nausea, diarrhea and vomitting, c/o chills

## 2012-03-12 LAB — CBC WITH DIFFERENTIAL/PLATELET
Basophils Absolute: 0 10*3/uL (ref 0.0–0.1)
Basophils Relative: 0 % (ref 0–1)
Eosinophils Absolute: 0.3 10*3/uL (ref 0.0–0.7)
Eosinophils Relative: 6 % — ABNORMAL HIGH (ref 0–5)
HCT: 40 % (ref 39.0–52.0)
Lymphocytes Relative: 35 % (ref 12–46)
MCH: 28.3 pg (ref 26.0–34.0)
MCHC: 33.5 g/dL (ref 30.0–36.0)
MCV: 84.6 fL (ref 78.0–100.0)
Monocytes Absolute: 0.8 10*3/uL (ref 0.1–1.0)
RDW: 14.5 % (ref 11.5–15.5)

## 2012-03-12 LAB — COMPREHENSIVE METABOLIC PANEL
AST: 42 U/L — ABNORMAL HIGH (ref 0–37)
CO2: 30 mEq/L (ref 19–32)
Calcium: 9.8 mg/dL (ref 8.4–10.5)
Creatinine, Ser: 1.56 mg/dL — ABNORMAL HIGH (ref 0.50–1.35)
GFR calc non Af Amer: 49 mL/min — ABNORMAL LOW (ref >90)
Total Protein: 7.7 g/dL (ref 6.0–8.3)

## 2012-03-12 MED ORDER — ONDANSETRON HCL 4 MG/2ML IJ SOLN
INTRAMUSCULAR | Status: AC
  Administered 2012-03-12: 01:00:00
  Filled 2012-03-12: qty 2

## 2012-03-12 MED ORDER — RANITIDINE HCL 150 MG PO CAPS: 150.0000 mg | ORAL_CAPSULE | Freq: Two times a day (BID) | ORAL | Status: DC

## 2012-03-12 MED ORDER — HYDROCODONE-ACETAMINOPHEN 5-325 MG PO TABS: 1.0000 | ORAL_TABLET | Freq: Four times a day (QID) | ORAL | Status: AC | PRN

## 2012-03-12 MED ORDER — ONDANSETRON 4 MG PO TBDP: 4.0000 mg | ORAL_TABLET | Freq: Three times a day (TID) | ORAL | Status: AC | PRN

## 2012-07-28 ENCOUNTER — Telehealth: Payer: Self-pay | Admitting: Orthopedic Surgery

## 2012-07-28 NOTE — Telephone Encounter (Signed)
Patient called to relay he will be back in touch about having his knee re-evaluated for surgery when he can arrange this. He mentioned a left hand problem, mainly numbness, and states had surgery 8 yrs ago by another orthopedic surgeon.  Relayed process of requesting medical records in order for Dr. Romeo Apple to review, or to follow up with the hand surgeon for this problem if he elects to do so.

## 2012-08-18 ENCOUNTER — Encounter (HOSPITAL_COMMUNITY): Payer: Self-pay | Admitting: Emergency Medicine

## 2012-08-18 ENCOUNTER — Emergency Department (HOSPITAL_COMMUNITY): Payer: Medicare Other

## 2012-08-18 ENCOUNTER — Emergency Department (HOSPITAL_COMMUNITY)
Admission: EM | Admit: 2012-08-18 | Discharge: 2012-08-18 | Disposition: A | Discharge status: Home / Self Care | Payer: Medicare Other | Attending: Emergency Medicine | Admitting: Emergency Medicine

## 2012-08-18 DIAGNOSIS — Z8739 Personal history of other diseases of the musculoskeletal system and connective tissue: Secondary | ICD-10-CM | POA: Insufficient documentation

## 2012-08-18 DIAGNOSIS — R42 Dizziness and giddiness: Secondary | ICD-10-CM | POA: Insufficient documentation

## 2012-08-18 DIAGNOSIS — Z8639 Personal history of other endocrine, nutritional and metabolic disease: Secondary | ICD-10-CM | POA: Insufficient documentation

## 2012-08-18 DIAGNOSIS — E119 Type 2 diabetes mellitus without complications: Secondary | ICD-10-CM | POA: Insufficient documentation

## 2012-08-18 DIAGNOSIS — Z8711 Personal history of peptic ulcer disease: Secondary | ICD-10-CM | POA: Insufficient documentation

## 2012-08-18 DIAGNOSIS — E669 Obesity, unspecified: Secondary | ICD-10-CM | POA: Insufficient documentation

## 2012-08-18 DIAGNOSIS — Z79899 Other long term (current) drug therapy: Secondary | ICD-10-CM | POA: Insufficient documentation

## 2012-08-18 DIAGNOSIS — R209 Unspecified disturbances of skin sensation: Secondary | ICD-10-CM | POA: Insufficient documentation

## 2012-08-18 DIAGNOSIS — Z8669 Personal history of other diseases of the nervous system and sense organs: Secondary | ICD-10-CM | POA: Insufficient documentation

## 2012-08-18 DIAGNOSIS — IMO0002 Reserved for concepts with insufficient information to code with codable children: Secondary | ICD-10-CM | POA: Insufficient documentation

## 2012-08-18 DIAGNOSIS — M1710 Unilateral primary osteoarthritis, unspecified knee: Secondary | ICD-10-CM | POA: Insufficient documentation

## 2012-08-18 DIAGNOSIS — F3289 Other specified depressive episodes: Secondary | ICD-10-CM | POA: Insufficient documentation

## 2012-08-18 DIAGNOSIS — R200 Anesthesia of skin: Secondary | ICD-10-CM

## 2012-08-18 DIAGNOSIS — J309 Allergic rhinitis, unspecified: Secondary | ICD-10-CM | POA: Insufficient documentation

## 2012-08-18 DIAGNOSIS — Z87828 Personal history of other (healed) physical injury and trauma: Secondary | ICD-10-CM | POA: Insufficient documentation

## 2012-08-18 DIAGNOSIS — F329 Major depressive disorder, single episode, unspecified: Secondary | ICD-10-CM | POA: Insufficient documentation

## 2012-08-18 DIAGNOSIS — Z862 Personal history of diseases of the blood and blood-forming organs and certain disorders involving the immune mechanism: Secondary | ICD-10-CM | POA: Insufficient documentation

## 2012-08-18 DIAGNOSIS — F411 Generalized anxiety disorder: Secondary | ICD-10-CM | POA: Insufficient documentation

## 2012-08-18 DIAGNOSIS — J45909 Unspecified asthma, uncomplicated: Secondary | ICD-10-CM | POA: Insufficient documentation

## 2012-08-18 LAB — GLUCOSE, CAPILLARY: Glucose-Capillary: 125 mg/dL — ABNORMAL HIGH (ref 70–99)

## 2012-08-18 MED ORDER — TRAMADOL HCL 50 MG PO TABS
100.0000 mg | ORAL_TABLET | Freq: Once | ORAL | Status: AC
  Administered 2012-08-18: 100 mg via ORAL
  Filled 2012-08-18: qty 2

## 2012-08-18 MED ORDER — PREDNISONE 20 MG PO TABS: ORAL_TABLET | ORAL | Status: DC

## 2012-08-18 MED ORDER — CYCLOBENZAPRINE HCL 10 MG PO TABS: 10.0000 mg | ORAL_TABLET | Freq: Three times a day (TID) | ORAL | Status: DC | PRN

## 2012-08-18 MED ORDER — PREDNISONE 50 MG PO TABS
60.0000 mg | ORAL_TABLET | Freq: Once | ORAL | Status: AC
  Administered 2012-08-18: 60 mg via ORAL
  Filled 2012-08-18: qty 1

## 2012-08-18 MED ORDER — TRAMADOL HCL 50 MG PO TABS: 100.0000 mg | ORAL_TABLET | Freq: Four times a day (QID) | ORAL | Status: DC | PRN

## 2012-08-18 NOTE — ED Notes (Signed)
Pt with left hand numbness x 1 week, also states that with HTN, cbg 125 and last ate at 1300 per pt, denies HA

## 2012-08-18 NOTE — ED Notes (Addendum)
Pt c/o L hand numb. Has had 2 surgeries on it in past but has never been numb. rom wnl. Pt also c/o high bp today. nad at this time. Pt denies cp/weakness/slurred speech

## 2012-08-18 NOTE — ED Provider Notes (Signed)
History     CSN: [id number]  Arrival date & time 08/18/12  1613   First MD Initiated Contact with Patient 08/18/12 1629      Chief Complaint  Patient presents with  . Hypertension    (Consider location/radiation/quality/duration/timing/severity/associated sxs/prior treatment) HPI  Patient relates about 3 or 4 days ago he wasn't feeling well, states he felt lightheaded and his checked his blood pressure was 156/102. He also relates about the same time he felt like he had "biting" on the inside of his left hand that also is described as a stabbing pain. He also states he started noticing numbness of his left little finger and left ring finger that also involved the dorsum of his left hand. He denies any known injury. He states he's never had numbness like that before. He states he did have surgery about 10 years ago but Dr. Teressa Senter for a injury with tendon involvement of his left hand. He denies any pain in his neck. He states he does have a frontal headache but it's like the headache he gets up his allergies. Patient is noted to be sniffing frequently during his exam. He denies nausea or vomiting. He states he has had blurred vision but he also gets that with his allergies. He denies any difficulty thinking of what he wants to say, he also denies any difficulty walking. He states over the last couple days it seems to be getting worse in that the numbness has spread up to the wrist. He denies any weakness in his arm and states is able to use it to dress himself and do other normal activities.   PCP Dr Sudie Bailey  Past Medical History  Diagnosis Date  . Allergic rhinitis   . Hypertension     benign essentia  . Carpal tunnel syndrome   . Intervertebral disc disorder   . DM (diabetes mellitus)     family hx  . Obesity   . Peptic ulcer     remote past  . Hypercholesterolemia   . Asthma   . Depressive reaction   . Paroxysmal vertigo   . Anxiety   . Arthritis   needs to have left total  knee replacement done soon by Dr. Romeo Apple   Past Surgical History  Procedure Date  . Hand surgery     left  . Colonoscopy 09/11/2011    Procedure: COLONOSCOPY;  Surgeon: Corbin Ade, MD;  Location: [facility];  Service: Endoscopy;  Laterality: N/A;  9:15    Family History  Problem Relation Age of Onset  . Colon cancer Paternal Uncle   . Pneumonia Father     died at age 70  . Heart disease    . Asthma    . Diabetes    + stokes in mothers family + CAD in fathers family HTN  History  Substance Use Topics  . Smoking status: Never Smoker   . Smokeless tobacco: Not on file  . Alcohol Use: Yes     Comment: glass of wine every now and then   On disability   Review of Systems  All other systems reviewed and are negative.    Allergies  Latex  Home Medications   Current Outpatient Rx  Name  Route  Sig  Dispense  Refill  . ALBUTEROL SULFATE HFA 108 (90 BASE) MCG/ACT IN AERS   Inhalation   Inhale 2 puffs into the lungs every 4 (four) hours as needed for wheezing or shortness of breath (cough).  1 Inhaler   0   . ALBUTEROL SULFATE (2.5 MG/3ML) 0.083% IN NEBU   Nebulization   Take 2.5 mg by nebulization every 4 (four) hours as needed.          . BECLOMETHASONE DIPROPIONATE 80 MCG/ACT IN AERS   Inhalation   Inhale 2 puffs into the lungs 2 (two) times daily as needed.         Marland Kitchen FLUTICASONE PROPIONATE 50 MCG/ACT NA SUSP   Nasal   Place 1 spray into the nose daily.         Marland Kitchen HYDROCODONE-ACETAMINOPHEN 10-650 MG PO TABS   Oral   Take 1 tablet by mouth every 4 (four) hours as needed. pain         . NEXIUM 40 MG PO CPDR   Oral   Take 40 mg by mouth daily before breakfast.          . POTASSIUM CHLORIDE CRYS ER 20 MEQ PO TBCR   Oral   Take 20 mEq by mouth every morning.          Marland Kitchen VALSARTAN-HYDROCHLOROTHIAZIDE 160-12.5 MG PO TABS   Oral   Take 1 tablet by mouth daily.          Marland Kitchen CITALOPRAM HYDROBROMIDE 20 MG PO TABS   Oral   Take 20 mg by  mouth at bedtime.          . IBUPROFEN 800 MG PO TABS   Oral   Take 800 mg by mouth every 8 (eight) hours as needed. Pain/inflammation         . JOINT HEALTH MINERAL PO TABS   Oral   Take 1 tablet by mouth 5 (five) times daily.           BP 127/79  Pulse 90  Temp 98 F (36.7 C) (Oral)  Resp 19  SpO2 100%  Vital signs normal    Physical Exam  Nursing note and vitals reviewed. Constitutional: He is oriented to person, place, and time. He appears well-developed and well-nourished.  Non-toxic appearance. He does not appear ill. No distress.  HENT:  Head: Normocephalic and atraumatic.  Right Ear: External ear normal.  Left Ear: External ear normal.  Nose: Nose normal. No mucosal edema or rhinorrhea.  Mouth/Throat: Oropharynx is clear and moist and mucous membranes are normal. No dental abscesses or uvula swelling.  Eyes: Conjunctivae normal and EOM are normal. Pupils are equal, round, and reactive to light.  Neck: Normal range of motion and full passive range of motion without pain. Neck supple.       nontender cervical spine and paraspinous muscles, nontender trapezius muscle.  Cardiovascular: Exam reveals no gallop and no friction rub.   No murmur heard. Pulmonary/Chest: Effort normal. No respiratory distress. He has no wheezes. He has no rhonchi. He has no rales. He exhibits tenderness. He exhibits no crepitus.  Abdominal: Soft. Normal appearance and bowel sounds are normal. He exhibits no distension. There is no tenderness. There is no rebound and no guarding.  Musculoskeletal: Normal range of motion. He exhibits no edema and no tenderness.       Moves all extremities well.   Patient has loss of flexion of his left little finger and less so in his left ring finger which she states has been present since surgery done 10 years ago. He has no weakness to dorsiflexion of the wrist. He has no weakness when testing the radial nerve. He is unable to cooperate totally with  testing the ulnar nerve because he is unable to touch his little finger to his thumb and can almost touch his ring finger to his palm. He has no weakness to keeping the fingers separated he is able to make a fist except for the weakness in the little and ring finger which he's had since his surgery.  Neurological: He is alert and oriented to person, place, and time. He has normal strength. No cranial nerve deficit.       There is no cranial nerve deficit noted  Skin: Skin is warm, dry and intact. No rash noted. No erythema. No pallor.  Psychiatric: He has a normal mood and affect. His speech is normal and behavior is normal. His mood appears not anxious.    ED Course  Procedures (including critical care time)  Patient states he is unable to do MRI, he states if he needs MRI he needs to have an open MRI done.  When we discussed the results of his scans at discharge patient now states he has already talked to Dr. Romeo Apple about the numbness in his hand and has been trying to get his records from Dr. Teressa Senter for Dr Romeo Apple.   Labs Reviewed  GLUCOSE, CAPILLARY - Abnormal; Notable for the following:    Glucose-Capillary 125 (*)     All other components within normal limits      Ct Head Wo Contrast Ct Cervical Spine Wo Contrast  08/18/2012  *RADIOLOGY REPORT*  Clinical Data:  52 year old male with headache, neck pain and left hand numbness.  CT HEAD WITHOUT CONTRAST CT CERVICAL SPINE WITHOUT CONTRAST  Technique:  Multidetector CT imaging of the head and cervical spine was performed following the standard protocol without intravenous contrast.  Multiplanar CT image reconstructions of the cervical spine were also generated.  Comparison:  None  CT HEAD  Findings: No intracranial abnormalities are identified, including mass lesion or mass effect, hydrocephalus, extra-axial fluid collection, midline shift, hemorrhage, or acute infarction.  The visualized bony calvarium is unremarkable.  IMPRESSION:  Unremarkable noncontrast head CT.  CT CERVICAL SPINE  Findings: Motion artifact limits evaluation and despite rescanning.  There is no evidence of fracture, subluxation or prevertebral soft tissue swelling. Mild to moderate degenerative disc disease and spondylosis at C5-C6 and C6-C7 noted. Mild central spinal and biforaminal narrowing is identified at C5-C6. No focal bony lesions are present.  No soft tissue abnormalities are identified.  IMPRESSION: No static evidence of acute injury to the cervical spine.  Mild to moderate degenerative disc disease and spondylosis at C5-C6 and C6-C7 with mild central spinal and biforaminal narrowing at C5- C6.   Original Report Authenticated By: Harmon Pier, M.D.    Dg Hand Complete Left  08/18/2012  *RADIOLOGY REPORT*  Clinical Data: Injury 10 days ago with numbness.  LEFT HAND - COMPLETE 3+ VIEW  Comparison: None.  Findings: No evidence of fracture, dislocation, radiopaque foreign object or other focal lesion.  IMPRESSION: Negative radiographs   Original Report Authenticated By: Paulina Fusi, M.D.      1. Numbness and tingling in left hand     New Prescriptions   CYCLOBENZAPRINE (FLEXERIL) 10 MG TABLET    Take 1 tablet (10 mg total) by mouth 3 (three) times daily as needed for muscle spasms.   PREDNISONE (DELTASONE) 20 MG TABLET    Take 3 po QD x 3d , then 2 po QD x 3d then 1 po QD x 3d   TRAMADOL (ULTRAM) 50 MG TABLET  Take 2 tablets (100 mg total) by mouth every 6 (six) hours as needed for pain.    Plan discharge  Devoria Albe, MD, FACEP   MDM          Ward Givens, MD 08/18/12 2227

## 2012-08-18 NOTE — Discharge Instructions (Signed)
Your scan today does not show evidence of a stroke. You do have some arthritis changes in your lower neck that may be causing a pinched nerve that runs from your neck into your finger. Take the prescribed medications. Please have Dr Sudie Bailey recheck you this week to see if you need further studies such as a MRI.

## 2012-08-30 ENCOUNTER — Other Ambulatory Visit: Payer: Self-pay

## 2012-08-30 MED ORDER — POLYETHYLENE GLYCOL 3350 17 GM/SCOOP PO POWD: 17.0000 g | Freq: Every day | ORAL | Status: AC

## 2013-02-04 IMAGING — CR DG ABDOMEN ACUTE W/ 1V CHEST
4 series · 4 of 4 positions shown · non-contrast
Comparison: CT abdomen and pelvis 02/21/2012.  Chest 10/28/2011.

CLINICAL DATA: Sick for 4 weeks due to abscess.  Nausea, diarrhea,
vomiting and chills.

ACUTE ABDOMEN SERIES (ABDOMEN 2 VIEW & CHEST 1 VIEW)

[view not recorded (1 of 4)]
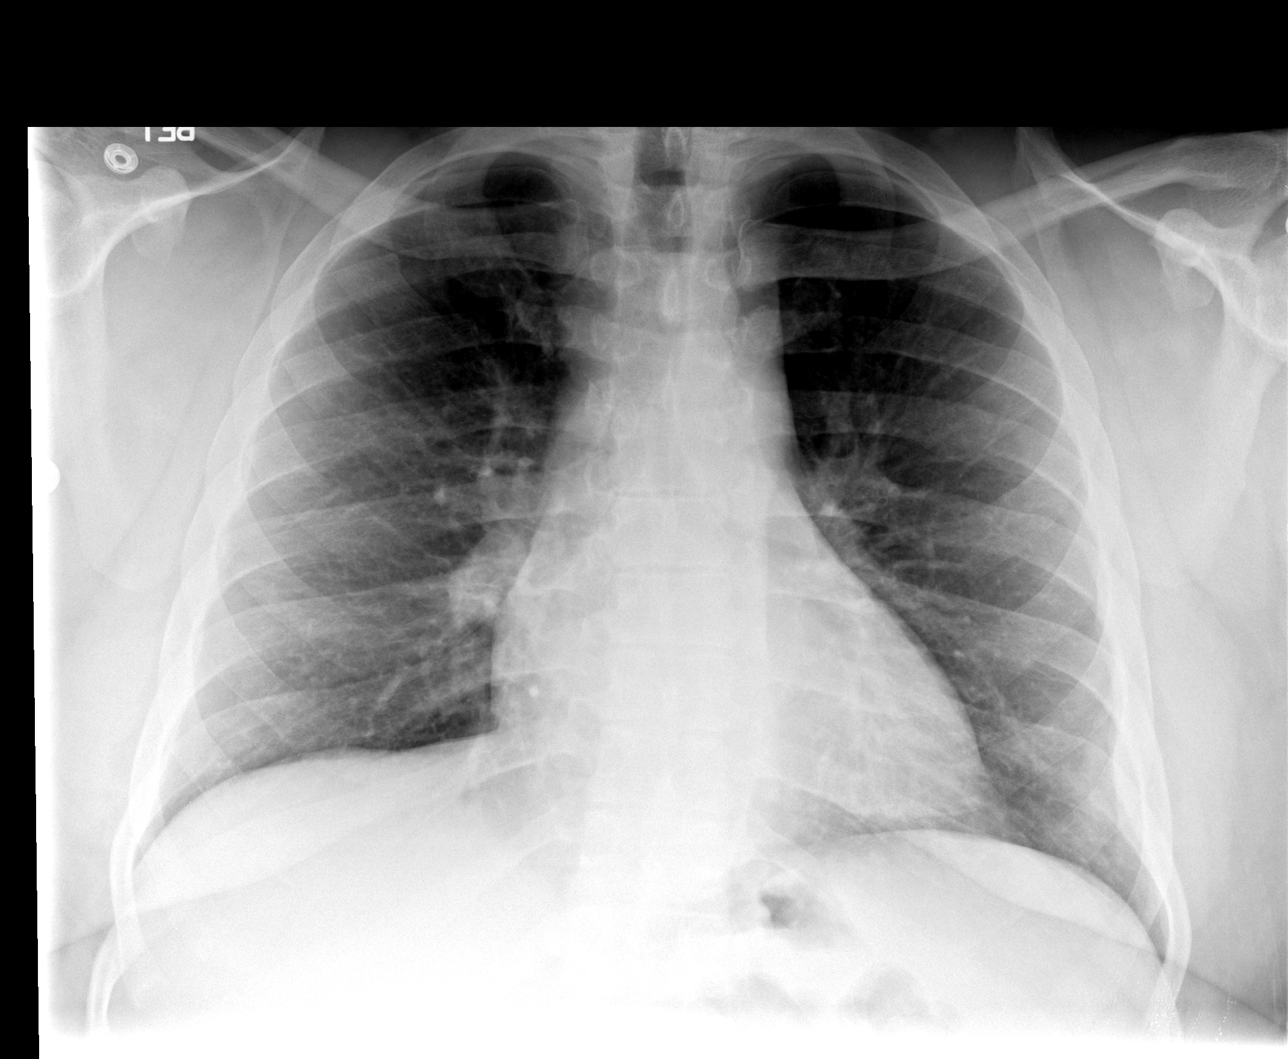

[view not recorded (2 of 4)]
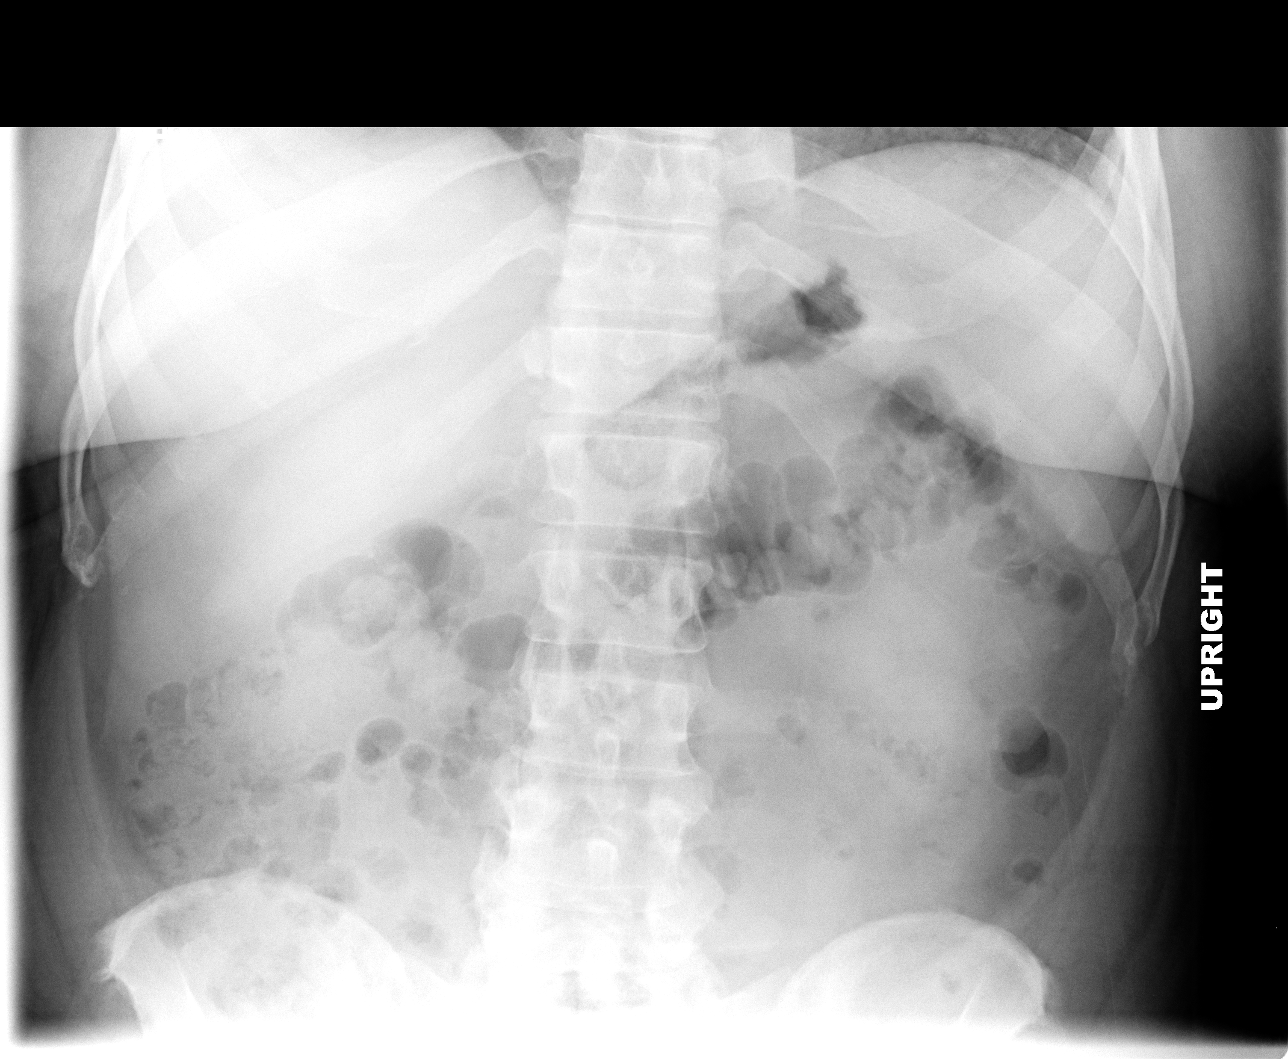

[view not recorded (3 of 4)]
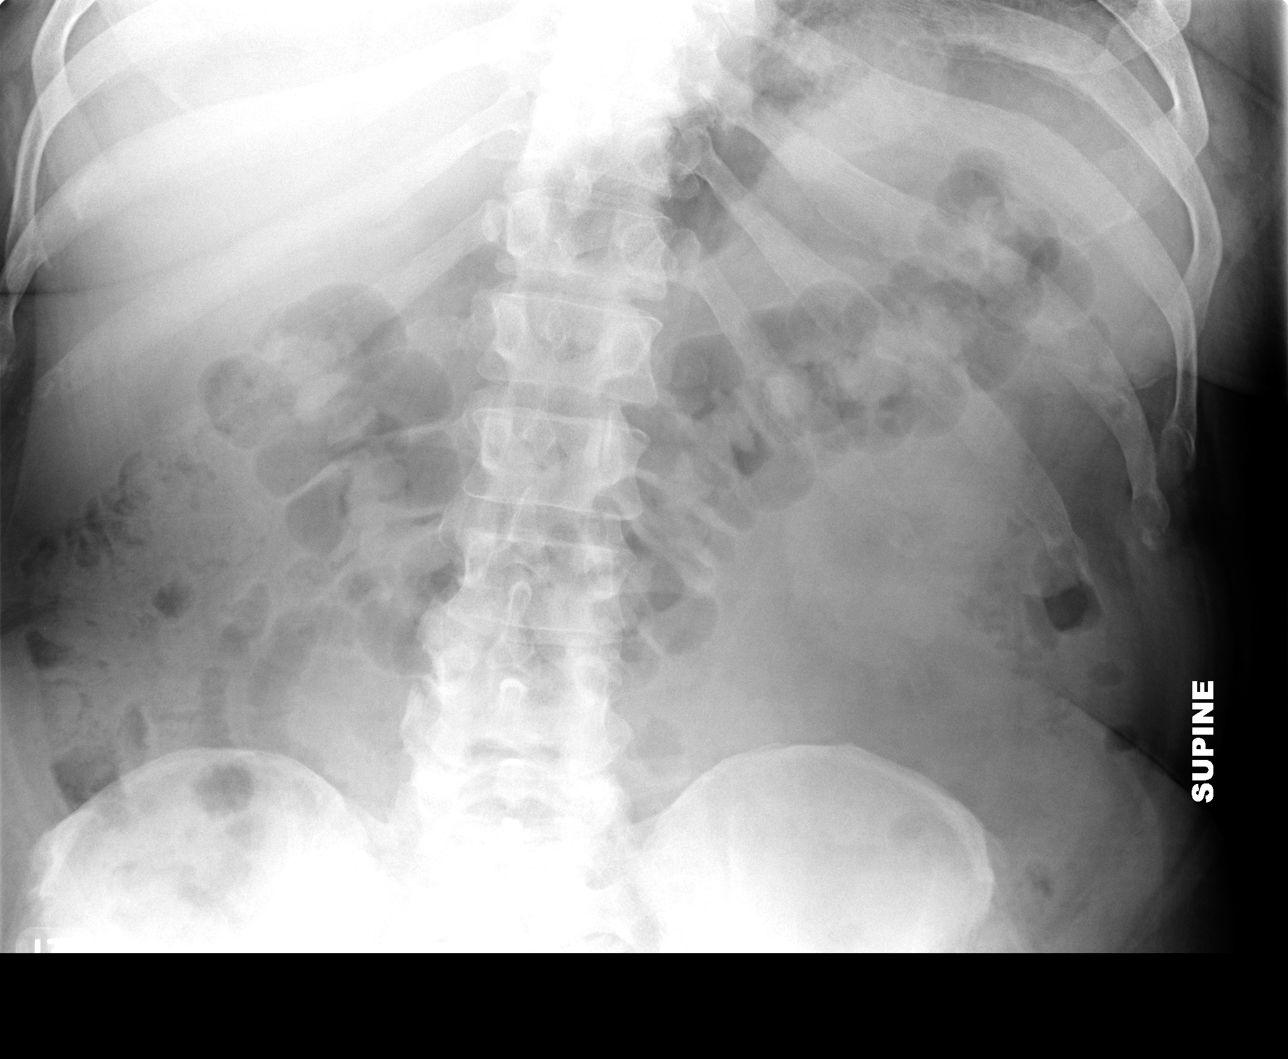

[view not recorded (4 of 4)]
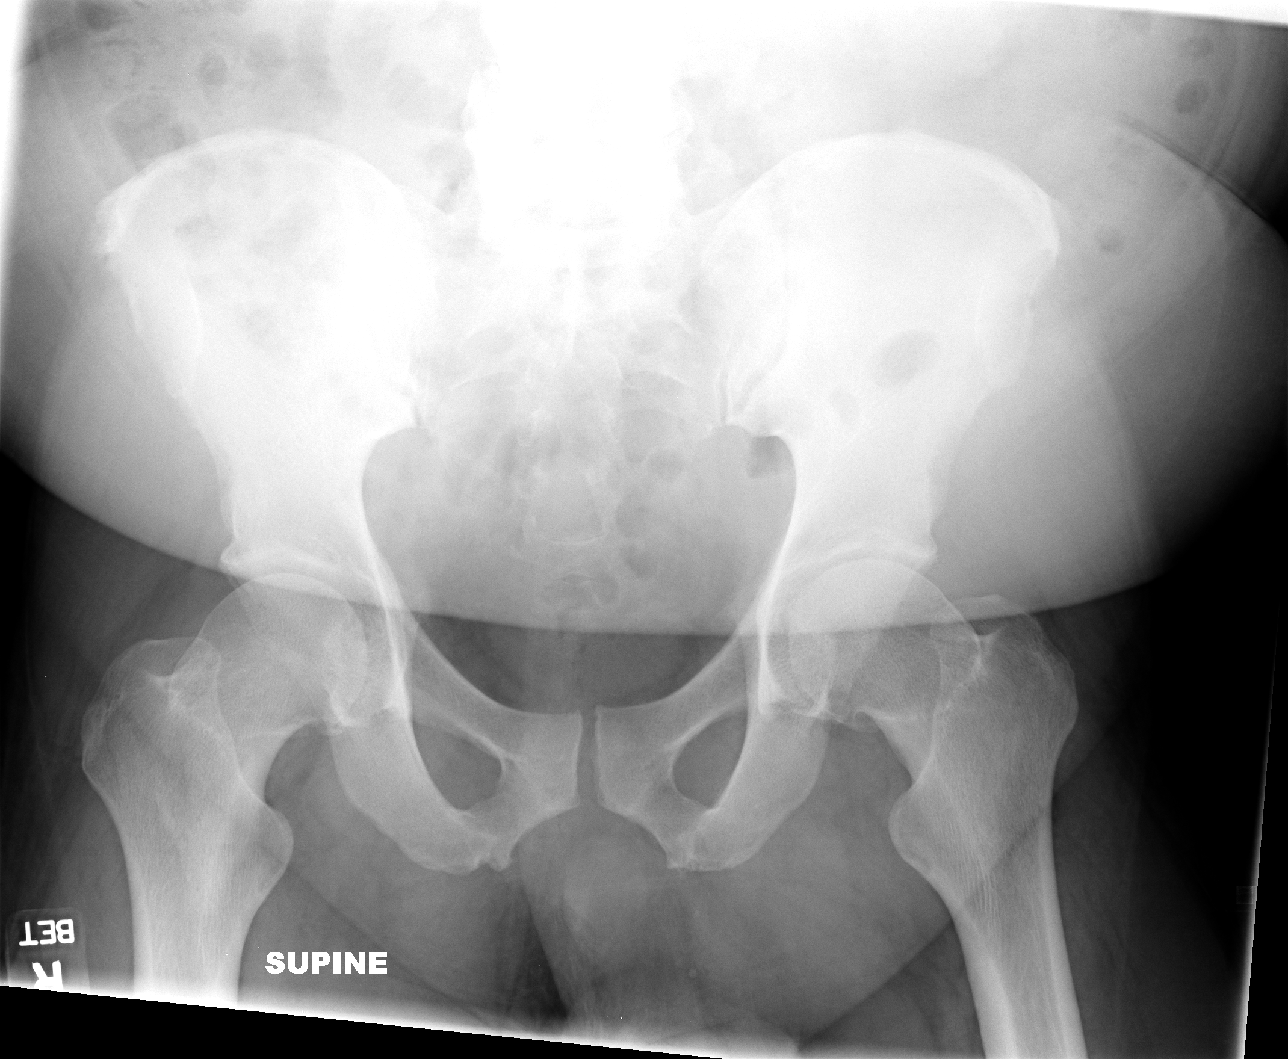

[4 of 4 positions shown; findings below may reference images not displayed]

FINDINGS: The heart size and pulmonary vascularity are normal. The
lungs appear clear and expanded without focal air space disease or
consolidation. No blunting of the costophrenic angles.  No
pneumothorax.  Mediastinal contours appear intact.  No significant
change since previous study.

Scattered gas and stool throughout the colon.  No small or large
bowel distension.  No free intra-abdominal air.  No abnormal air
fluid levels.  No radiopaque stones.  Mild degenerative changes in
the lumbar spine and hips.
IMPRESSION: No evidence of active pulmonary disease.  Nonobstructive bowel gas
pattern.

## 2013-02-22 ENCOUNTER — Ambulatory Visit: Payer: Medicare Other | Admitting: Orthopedic Surgery

## 2013-03-01 ENCOUNTER — Other Ambulatory Visit: Payer: Self-pay | Admitting: Orthopedic Surgery

## 2013-03-01 ENCOUNTER — Ambulatory Visit: Payer: Medicare Other | Admitting: Orthopedic Surgery

## 2013-03-01 DIAGNOSIS — M25562 Pain in left knee: Secondary | ICD-10-CM

## 2013-03-07 ENCOUNTER — Ambulatory Visit: Payer: Medicare Other | Admitting: Orthopedic Surgery

## 2013-03-31 ENCOUNTER — Emergency Department (HOSPITAL_COMMUNITY)
Admission: EM | Admit: 2013-03-31 | Discharge: 2013-03-31 | Disposition: A | Discharge status: Home / Self Care | Payer: Medicare Other | Attending: Emergency Medicine | Admitting: Emergency Medicine

## 2013-03-31 ENCOUNTER — Encounter (HOSPITAL_COMMUNITY): Payer: Self-pay | Admitting: *Deleted

## 2013-03-31 ENCOUNTER — Emergency Department (HOSPITAL_COMMUNITY): Payer: Medicare Other

## 2013-03-31 DIAGNOSIS — Z79899 Other long term (current) drug therapy: Secondary | ICD-10-CM | POA: Insufficient documentation

## 2013-03-31 DIAGNOSIS — E119 Type 2 diabetes mellitus without complications: Secondary | ICD-10-CM | POA: Insufficient documentation

## 2013-03-31 DIAGNOSIS — Z8669 Personal history of other diseases of the nervous system and sense organs: Secondary | ICD-10-CM | POA: Insufficient documentation

## 2013-03-31 DIAGNOSIS — J45909 Unspecified asthma, uncomplicated: Secondary | ICD-10-CM

## 2013-03-31 DIAGNOSIS — M129 Arthropathy, unspecified: Secondary | ICD-10-CM | POA: Insufficient documentation

## 2013-03-31 DIAGNOSIS — R6883 Chills (without fever): Secondary | ICD-10-CM | POA: Insufficient documentation

## 2013-03-31 DIAGNOSIS — J45901 Unspecified asthma with (acute) exacerbation: Secondary | ICD-10-CM | POA: Insufficient documentation

## 2013-03-31 DIAGNOSIS — IMO0002 Reserved for concepts with insufficient information to code with codable children: Secondary | ICD-10-CM | POA: Insufficient documentation

## 2013-03-31 DIAGNOSIS — Z8711 Personal history of peptic ulcer disease: Secondary | ICD-10-CM | POA: Insufficient documentation

## 2013-03-31 DIAGNOSIS — Z91040 Latex allergy status: Secondary | ICD-10-CM | POA: Insufficient documentation

## 2013-03-31 DIAGNOSIS — J3489 Other specified disorders of nose and nasal sinuses: Secondary | ICD-10-CM | POA: Insufficient documentation

## 2013-03-31 DIAGNOSIS — I10 Essential (primary) hypertension: Secondary | ICD-10-CM | POA: Insufficient documentation

## 2013-03-31 DIAGNOSIS — E669 Obesity, unspecified: Secondary | ICD-10-CM | POA: Insufficient documentation

## 2013-03-31 DIAGNOSIS — R51 Headache: Secondary | ICD-10-CM | POA: Insufficient documentation

## 2013-03-31 DIAGNOSIS — E7800 Pure hypercholesterolemia, unspecified: Secondary | ICD-10-CM | POA: Insufficient documentation

## 2013-03-31 DIAGNOSIS — F411 Generalized anxiety disorder: Secondary | ICD-10-CM | POA: Insufficient documentation

## 2013-03-31 DIAGNOSIS — J029 Acute pharyngitis, unspecified: Secondary | ICD-10-CM | POA: Insufficient documentation

## 2013-03-31 MED ORDER — PREDNISONE 20 MG PO TABS
40.0000 mg | ORAL_TABLET | Freq: Once | ORAL | Status: AC
  Administered 2013-03-31: 40 mg via ORAL
  Filled 2013-03-31: qty 2

## 2013-03-31 MED ORDER — IPRATROPIUM BROMIDE 0.02 % IN SOLN
0.5000 mg | Freq: Once | RESPIRATORY_TRACT | Status: AC
  Administered 2013-03-31: 0.5 mg via RESPIRATORY_TRACT
  Filled 2013-03-31: qty 2.5

## 2013-03-31 MED ORDER — ALBUTEROL SULFATE (2.5 MG/3ML) 0.083% IN NEBU: 2.5000 mg | INHALATION_SOLUTION | Freq: Four times a day (QID) | RESPIRATORY_TRACT | Status: DC | PRN

## 2013-03-31 MED ORDER — ALBUTEROL SULFATE (5 MG/ML) 0.5% IN NEBU
2.5000 mg | INHALATION_SOLUTION | Freq: Once | RESPIRATORY_TRACT | Status: AC
  Administered 2013-03-31: 2.5 mg via RESPIRATORY_TRACT
  Filled 2013-03-31: qty 0.5

## 2013-03-31 MED ORDER — GUAIFENESIN-CODEINE 100-10 MG/5ML PO SYRP: 5.0000 mL | ORAL_SOLUTION | Freq: Three times a day (TID) | ORAL | Status: DC | PRN

## 2013-03-31 MED ORDER — PREDNISONE 10 MG PO TABS: 20.0000 mg | ORAL_TABLET | Freq: Two times a day (BID) | ORAL | Status: DC

## 2013-03-31 NOTE — ED Provider Notes (Signed)
CSN: [id number]     Arrival date & time 03/31/13  1450 History   First MD Initiated Contact with Patient 03/31/13 1534     Chief Complaint  Patient presents with  . Cough   (Consider location/radiation/quality/duration/timing/severity/associated sxs/prior Treatment) Patient is a 52 y.o. male presenting with cough. The history is provided by the patient.  Cough Cough characteristics:  Productive Sputum characteristics:  Yellow Severity:  Moderate Onset quality:  Gradual Duration:  4 days Timing:  Intermittent Progression:  Worsening Chronicity:  New Smoker: no   Relieved by:  Nothing Worsened by:  Lying down Ineffective treatments:  Beta-agonist inhaler Associated symptoms: chills, headaches, rhinorrhea, shortness of breath, sinus congestion, sore throat and wheezing   Associated symptoms: no ear pain, no eye discharge, no fever and no rash    Parker Rodriguez is a 52 y.o. male who presents to the ED with cough and wheezing. He is out of his albuterol neb medication. He has used his pocket inhaler without relief. The symptoms are worse at night when he is trying to sleep. He denies fever.  Past Medical History  Diagnosis Date  . Allergic rhinitis   . Hypertension     benign essentia  . Carpal tunnel syndrome   . Intervertebral disc disorder   . DM (diabetes mellitus)     family hx  . Obesity   . Peptic ulcer     remote past  . Hypercholesterolemia   . Asthma   . Depressive reaction   . Paroxysmal vertigo   . Anxiety   . Arthritis    Past Surgical History  Procedure Laterality Date  . Hand surgery      left  . Colonoscopy  09/11/2011    Procedure: COLONOSCOPY;  Surgeon: Corbin Ade, MD;  Location: [facility];  Service: Endoscopy;  Laterality: N/A;  9:15   Family History  Problem Relation Age of Onset  . Colon cancer Paternal Uncle   . Pneumonia Father     died at age 44  . Heart disease    . Asthma    . Diabetes     History  Substance Use Topics  .  Smoking status: Never Smoker   . Smokeless tobacco: Not on file  . Alcohol Use: Yes     Comment: glass of wine every now and then    Review of Systems  Constitutional: Positive for chills. Negative for fever.  HENT: Positive for sore throat and rhinorrhea. Negative for ear pain.   Eyes: Negative for discharge and visual disturbance.  Respiratory: Positive for cough, shortness of breath and wheezing.   Gastrointestinal: Negative for nausea, vomiting and abdominal pain.  Genitourinary: Positive for frequency. Negative for dysuria and urgency.  Skin: Negative for rash.  Neurological: Positive for headaches. Negative for light-headedness.  Psychiatric/Behavioral: The patient is not nervous/anxious.     Allergies  Latex  Home Medications   Current Outpatient Rx  Name  Route  Sig  Dispense  Refill  . albuterol (PROVENTIL HFA;VENTOLIN HFA) 108 (90 BASE) MCG/ACT inhaler   Inhalation   Inhale 2 puffs into the lungs every 4 (four) hours as needed for wheezing or shortness of breath (cough).   1 Inhaler   0   . beclomethasone (QVAR) 80 MCG/ACT inhaler   Inhalation   Inhale 2 puffs into the lungs 2 (two) times daily as needed.         . citalopram (CELEXA) 20 MG tablet   Oral   Take  20 mg by mouth at bedtime.          . cyclobenzaprine (FLEXERIL) 10 MG tablet   Oral   Take 1 tablet (10 mg total) by mouth 3 (three) times daily as needed for muscle spasms.   30 tablet   0   . fluticasone (FLONASE) 50 MCG/ACT nasal spray   Nasal   Place 1 spray into the nose daily.         Marland Kitchen HYDROcodone-acetaminophen (LORCET) 10-650 MG per tablet   Oral   Take 1 tablet by mouth every 4 (four) hours as needed. pain         . ibuprofen (ADVIL,MOTRIN) 800 MG tablet   Oral   Take 800 mg by mouth every 8 (eight) hours as needed. Pain/inflammation         . Multiple Minerals (JOINT HEALTH MINERAL) TABS   Oral   Take 1 tablet by mouth 5 (five) times daily.         Marland Kitchen NEXIUM 40 MG  capsule   Oral   Take 40 mg by mouth daily before breakfast.          . polyethylene glycol powder (GLYCOLAX/MIRALAX) powder   Oral   Take 17 g by mouth daily.   255 g   3   . potassium chloride SA (K-DUR,KLOR-CON) 20 MEQ tablet   Oral   Take 20 mEq by mouth every morning.          . valsartan-hydrochlorothiazide (DIOVAN-HCT) 160-12.5 MG per tablet   Oral   Take 1 tablet by mouth daily.          Marland Kitchen albuterol (PROVENTIL) (2.5 MG/3ML) 0.083% nebulizer solution   Nebulization   Take 2.5 mg by nebulization every 4 (four) hours as needed.           BP 135/74  Pulse 79  Temp(Src) 98.1 F (36.7 C)  Resp 20  Ht 6' (1.829 m)  Wt 274 lb (124.286 kg)  BMI 37.15 kg/m2  SpO2 97% Physical Exam  Nursing note and vitals reviewed. Constitutional: He is oriented to person, place, and time. He appears well-developed and well-nourished.  HENT:  Head: Normocephalic and atraumatic.  Eyes: EOM are normal.  Neck: Neck supple.  Cardiovascular: Normal rate, regular rhythm and normal heart sounds.   Pulmonary/Chest: Effort normal. No respiratory distress. He has decreased breath sounds. He has wheezes.  Musculoskeletal: Normal range of motion.  Neurological: He is alert and oriented to person, place, and time. No cranial nerve deficit.  Skin: Skin is warm and dry.  Psychiatric: He has a normal mood and affect. His behavior is normal.    Dg Chest 2 View  03/31/2013   *RADIOLOGY REPORT*  Clinical Data: Cough, wheezing  CHEST - 2 VIEW  Comparison: October 28, 2011.  Findings: Cardiomediastinal silhouette appears normal.  No acute pulmonary disease is noted.  Bony thorax is intact.  IMPRESSION: No acute cardiopulmonary abnormality seen.   Original Report Authenticated By: Lupita Raider.,  M.D.    ED Course: Re examined @ [phone] patient feeling better decreased wheezing 02 Sat 97 % on R/A   Procedures MDM  52 y.o. male with asthmatic bronchitis. Patient treated with prednisone, robitussin AC  and neb treatment here in the ED with good results.  I have reviewed this patient's vital signs, nurses notes, appropriate labs and imaging.  I have discussed findings and plan of care with the patient and need for follow up. Patient voices understanding. Patient  stable for discharge home without any immediate complications.    Medication List    TAKE these medications       guaiFENesin-codeine 100-10 MG/5ML syrup  Commonly known as:  ROBITUSSIN AC  Take 5 mLs by mouth 3 (three) times daily as needed for cough.     predniSONE 10 MG tablet  Commonly known as:  DELTASONE  Take 2 tablets (20 mg total) by mouth 2 (two) times daily.      ASK your doctor about these medications       albuterol (2.5 MG/3ML) 0.083% nebulizer solution  Commonly known as:  PROVENTIL  Take 2.5 mg by nebulization every 4 (four) hours as needed.  Ask about: Which instructions should I use?     albuterol 108 (90 BASE) MCG/ACT inhaler  Commonly known as:  PROVENTIL HFA;VENTOLIN HFA  Inhale 2 puffs into the lungs every 4 (four) hours as needed for wheezing or shortness of breath (cough).  Ask about: Which instructions should I use?     albuterol (2.5 MG/3ML) 0.083% nebulizer solution  Commonly known as:  PROVENTIL  Take 3 mLs (2.5 mg total) by nebulization every 6 (six) hours as needed for wheezing.  Ask about: Which instructions should I use?     beclomethasone 80 MCG/ACT inhaler  Commonly known as:  QVAR  Inhale 2 puffs into the lungs 2 (two) times daily as needed.     citalopram 20 MG tablet  Commonly known as:  CELEXA  Take 20 mg by mouth at bedtime.     cyclobenzaprine 10 MG tablet  Commonly known as:  FLEXERIL  Take 1 tablet (10 mg total) by mouth 3 (three) times daily as needed for muscle spasms.     fluticasone 50 MCG/ACT nasal spray  Commonly known as:  FLONASE  Place 1 spray into the nose daily.     HYDROcodone-acetaminophen 10-650 MG per tablet  Commonly known as:  LORCET  Take 1 tablet  by mouth every 4 (four) hours as needed. pain     ibuprofen 800 MG tablet  Commonly known as:  ADVIL,MOTRIN  Take 800 mg by mouth every 8 (eight) hours as needed. Pain/inflammation     JOINT HEALTH MINERAL Tabs  Take 1 tablet by mouth 5 (five) times daily.     NEXIUM 40 MG capsule  Generic drug:  esomeprazole  Take 40 mg by mouth daily before breakfast.     polyethylene glycol powder powder  Commonly known as:  GLYCOLAX/MIRALAX  Take 17 g by mouth daily.     potassium chloride SA 20 MEQ tablet  Commonly known as:  K-DUR,KLOR-CON  Take 20 mEq by mouth every morning.     valsartan-hydrochlorothiazide 160-12.5 MG per tablet  Commonly known as:  DIOVAN-HCT  Take 1 tablet by mouth daily.           [facility] Orlene Och, [place] 03/31/13 1736

## 2013-03-31 NOTE — ED Notes (Signed)
Cough for the past 4 days, states he has had episodes for wheezing

## 2013-03-31 NOTE — ED Provider Notes (Signed)
Medical screening examination/treatment/procedure(s) were performed by non-physician practitioner and as supervising physician I was immediately available for consultation/collaboration.  Alyza Artiaga, MD 03/31/13 2041 

## 2013-05-12 ENCOUNTER — Ambulatory Visit: Payer: Medicare Other | Admitting: Orthopedic Surgery

## 2013-06-08 ENCOUNTER — Telehealth: Payer: Self-pay | Admitting: Orthopedic Surgery

## 2013-06-08 NOTE — Telephone Encounter (Signed)
Patient has called to cancel his appointment for his left knee evaluation with Xray fortomorrow, 06/09/13, which is the 4th appointment he has re-scheduled, due to either a schedule conflict, financial reasons with co-pay, or due to out of town company.

## 2013-06-09 ENCOUNTER — Ambulatory Visit: Payer: Medicare Other | Admitting: Orthopedic Surgery

## 2013-06-13 NOTE — Telephone Encounter (Signed)
NO Additional note

## 2013-07-19 ENCOUNTER — Ambulatory Visit: Payer: Medicare Other | Admitting: Orthopedic Surgery

## 2014-01-02 ENCOUNTER — Ambulatory Visit (INDEPENDENT_AMBULATORY_CARE_PROVIDER_SITE_OTHER): Payer: Commercial Managed Care - HMO

## 2014-01-02 ENCOUNTER — Ambulatory Visit (INDEPENDENT_AMBULATORY_CARE_PROVIDER_SITE_OTHER): Payer: Commercial Managed Care - HMO | Admitting: Orthopedic Surgery

## 2014-01-02 VITALS — Ht 72.0 in | Wt 287.0 lb

## 2014-01-02 DIAGNOSIS — M25579 Pain in unspecified ankle and joints of unspecified foot: Secondary | ICD-10-CM

## 2014-01-02 MED ORDER — HYDROCODONE-ACETAMINOPHEN 5-325 MG PO TABS: 1.0000 | ORAL_TABLET | Freq: Four times a day (QID) | ORAL | Status: DC | PRN

## 2014-01-02 NOTE — Patient Instructions (Addendum)
Walking boot x 6 weeks   (lamisil)

## 2014-01-02 NOTE — Progress Notes (Signed)
Patient ID: Parker Rodriguez, male   DOB: Dec 29, 1960, 53 y.o.   MRN: [id number]  Chief Complaint  Patient presents with  . Ankle Pain    Left ankle pain, referred by Dr. Karie Kirks.    53 year old male complains of lateral ankle pain, he denies any trauma. He denies any previous treatment. The pain is dull aching somewhat throbbing associated with significant ankle swelling including retrocalcaneal swelling Which is worse after long periods of walking. No radiation of the pain up or down the foot or leg. Walking seems to make it worse  Review of systems denies weight loss instability poor healing numbness or tingling  Past Medical History  Diagnosis Date  . Allergic rhinitis   . Hypertension     benign essentia  . Carpal tunnel syndrome   . Intervertebral disc disorder   . DM (diabetes mellitus)     family hx  . Obesity   . Peptic ulcer     remote past  . Hypercholesterolemia   . Asthma   . Depressive reaction   . Paroxysmal vertigo   . Anxiety   . Arthritis      Exam Ht 6' (1.829 m)  Wt 287 lb (130.182 kg)  BMI 38.92 kg/m2 General appearance he has some moderate obesity. He is oriented x3 his mood is pleasant his affect is normal he ambulates without a limp  He does have weakness in his posterior tibial tendon with a chronic flatfoot and pain and tenderness in the lateral side of the foot secondary to pes planus which is flexible. Is tenderness in the lateral foot and ankle as stated with normal ankle range of motion, his ankle is stable his posterior tibial tendon strength shows weakness. He had painful heel rise. Skin showed flaky scaly plantar skin. He had a fungal infection in the great toe and possibly lesser digits as well. His pulses were good the foot was warm to touch she had normal sensation in the foot and no pathologic reflexes coordination and balance were normal  His ankle x-rays were normal  Impression posterior tibial tendon dysfunction  Recommend Cam Walker  short for 6 weeks and then reassess. Meds ordered this encounter  Medications  . HYDROcodone-acetaminophen (NORCO/VICODIN) 5-325 MG per tablet    Sig: Take 1 tablet by mouth every 6 (six) hours as needed for moderate pain.    Dispense:  90 tablet    Refill:  0

## 2014-02-07 ENCOUNTER — Other Ambulatory Visit: Payer: Self-pay | Admitting: *Deleted

## 2014-02-07 ENCOUNTER — Telehealth: Payer: Self-pay | Admitting: Orthopedic Surgery

## 2014-02-07 DIAGNOSIS — M25579 Pain in unspecified ankle and joints of unspecified foot: Secondary | ICD-10-CM

## 2014-02-07 MED ORDER — HYDROCODONE-ACETAMINOPHEN 5-325 MG PO TABS: 1.0000 | ORAL_TABLET | Freq: Four times a day (QID) | ORAL | Status: DC | PRN

## 2014-02-07 NOTE — Telephone Encounter (Signed)
Routing to dr harrison 

## 2014-02-07 NOTE — Telephone Encounter (Signed)
Ok

## 2014-02-07 NOTE — Telephone Encounter (Signed)
Roxan Diesel asked for a prescription for Hydrocodone 5/325 [phone]

## 2014-02-09 NOTE — Telephone Encounter (Signed)
Patient picked up the prescription.

## 2014-02-14 ENCOUNTER — Encounter: Payer: Self-pay | Admitting: Orthopedic Surgery

## 2014-02-14 ENCOUNTER — Ambulatory Visit: Payer: Commercial Managed Care - HMO | Admitting: Orthopedic Surgery

## 2014-06-13 ENCOUNTER — Emergency Department (HOSPITAL_COMMUNITY): Payer: Medicare HMO

## 2014-06-13 ENCOUNTER — Emergency Department (HOSPITAL_COMMUNITY)
Admission: EM | Admit: 2014-06-13 | Discharge: 2014-06-13 | Disposition: A | Discharge status: Home / Self Care | Payer: Medicare HMO | Attending: Emergency Medicine | Admitting: Emergency Medicine

## 2014-06-13 ENCOUNTER — Encounter (HOSPITAL_COMMUNITY): Payer: Self-pay | Admitting: Emergency Medicine

## 2014-06-13 DIAGNOSIS — J45909 Unspecified asthma, uncomplicated: Secondary | ICD-10-CM | POA: Diagnosis not present

## 2014-06-13 DIAGNOSIS — R109 Unspecified abdominal pain: Secondary | ICD-10-CM

## 2014-06-13 DIAGNOSIS — F329 Major depressive disorder, single episode, unspecified: Secondary | ICD-10-CM | POA: Diagnosis not present

## 2014-06-13 DIAGNOSIS — E782 Mixed hyperlipidemia: Secondary | ICD-10-CM | POA: Diagnosis not present

## 2014-06-13 DIAGNOSIS — R1010 Upper abdominal pain, unspecified: Secondary | ICD-10-CM | POA: Diagnosis not present

## 2014-06-13 DIAGNOSIS — Z79899 Other long term (current) drug therapy: Secondary | ICD-10-CM | POA: Insufficient documentation

## 2014-06-13 DIAGNOSIS — F419 Anxiety disorder, unspecified: Secondary | ICD-10-CM | POA: Insufficient documentation

## 2014-06-13 DIAGNOSIS — E669 Obesity, unspecified: Secondary | ICD-10-CM | POA: Insufficient documentation

## 2014-06-13 DIAGNOSIS — M1990 Unspecified osteoarthritis, unspecified site: Secondary | ICD-10-CM | POA: Diagnosis not present

## 2014-06-13 DIAGNOSIS — Z792 Long term (current) use of antibiotics: Secondary | ICD-10-CM | POA: Insufficient documentation

## 2014-06-13 DIAGNOSIS — Z91040 Latex allergy status: Secondary | ICD-10-CM | POA: Insufficient documentation

## 2014-06-13 DIAGNOSIS — Z7952 Long term (current) use of systemic steroids: Secondary | ICD-10-CM | POA: Diagnosis not present

## 2014-06-13 DIAGNOSIS — E119 Type 2 diabetes mellitus without complications: Secondary | ICD-10-CM | POA: Diagnosis not present

## 2014-06-13 DIAGNOSIS — R630 Anorexia: Secondary | ICD-10-CM | POA: Diagnosis not present

## 2014-06-13 DIAGNOSIS — I10 Essential (primary) hypertension: Secondary | ICD-10-CM | POA: Diagnosis not present

## 2014-06-13 LAB — COMPREHENSIVE METABOLIC PANEL
ALT: 29 U/L (ref 0–53)
ANION GAP: 12 (ref 5–15)
AST: 38 U/L — ABNORMAL HIGH (ref 0–37)
Albumin: 3 g/dL — ABNORMAL LOW (ref 3.5–5.2)
Alkaline Phosphatase: 111 U/L (ref 39–117)
BUN: 15 mg/dL (ref 6–23)
CALCIUM: 9 mg/dL (ref 8.4–10.5)
CO2: 30 meq/L (ref 19–32)
CREATININE: 1.64 mg/dL — AB (ref 0.50–1.35)
Chloride: 92 mEq/L — ABNORMAL LOW (ref 96–112)
GFR, EST AFRICAN AMERICAN: 53 mL/min — AB (ref >90)
GFR, EST NON AFRICAN AMERICAN: 46 mL/min — AB (ref >90)
Glucose, Bld: 115 mg/dL — ABNORMAL HIGH (ref 70–99)
Potassium: 3.5 mEq/L — ABNORMAL LOW (ref 3.7–5.3)
Sodium: 134 mEq/L — ABNORMAL LOW (ref 137–147)
TOTAL PROTEIN: 7.7 g/dL (ref 6.0–8.3)
Total Bilirubin: 0.9 mg/dL (ref 0.3–1.2)

## 2014-06-13 LAB — URINALYSIS, ROUTINE W REFLEX MICROSCOPIC
Bilirubin Urine: NEGATIVE
Glucose, UA: NEGATIVE mg/dL
Ketones, ur: NEGATIVE mg/dL
Leukocytes, UA: NEGATIVE
NITRITE: NEGATIVE
PH: 6 (ref 5.0–8.0)
Protein, ur: NEGATIVE mg/dL
Urobilinogen, UA: 0.2 mg/dL (ref 0.0–1.0)

## 2014-06-13 LAB — CBC WITH DIFFERENTIAL/PLATELET
BASOS ABS: 0.1 10*3/uL (ref 0.0–0.1)
Basophils Relative: 1 % (ref 0–1)
Eosinophils Absolute: 0.3 10*3/uL (ref 0.0–0.7)
Eosinophils Relative: 2 % (ref 0–5)
HCT: 36.8 % — ABNORMAL LOW (ref 39.0–52.0)
Hemoglobin: 12.7 g/dL — ABNORMAL LOW (ref 13.0–17.0)
LYMPHS ABS: 1.3 10*3/uL (ref 0.7–4.0)
Lymphocytes Relative: 12 % (ref 12–46)
MCH: 28.9 pg (ref 26.0–34.0)
MCHC: 34.5 g/dL (ref 30.0–36.0)
MCV: 83.6 fL (ref 78.0–100.0)
MONO ABS: 1.2 10*3/uL — AB (ref 0.1–1.0)
Monocytes Relative: 12 % (ref 3–12)
NEUTROS ABS: 7.8 10*3/uL — AB (ref 1.7–7.7)
Neutrophils Relative %: 73 % (ref 43–77)
Platelets: 300 10*3/uL (ref 150–400)
RBC: 4.4 MIL/uL (ref 4.22–5.81)
RDW: 13 % (ref 11.5–15.5)
WBC: 10.7 10*3/uL — AB (ref 4.0–10.5)

## 2014-06-13 LAB — URINE MICROSCOPIC-ADD ON

## 2014-06-13 LAB — LIPASE, BLOOD: LIPASE: 37 U/L (ref 11–59)

## 2014-06-13 MED ORDER — MORPHINE SULFATE 4 MG/ML IJ SOLN
4.0000 mg | Freq: Once | INTRAMUSCULAR | Status: AC
  Administered 2014-06-13: 4 mg via INTRAVENOUS
  Filled 2014-06-13: qty 1

## 2014-06-13 MED ORDER — PROMETHAZINE HCL 25 MG PO TABS: 25.0000 mg | ORAL_TABLET | Freq: Four times a day (QID) | ORAL | Status: DC | PRN

## 2014-06-13 MED ORDER — IOHEXOL 300 MG/ML  SOLN
80.0000 mL | Freq: Once | INTRAMUSCULAR | Status: AC | PRN
  Administered 2014-06-13: 80 mL via INTRAVENOUS

## 2014-06-13 MED ORDER — IOHEXOL 300 MG/ML  SOLN
50.0000 mL | Freq: Once | INTRAMUSCULAR | Status: AC | PRN
  Administered 2014-06-13: 50 mL via ORAL

## 2014-06-13 MED ORDER — SODIUM CHLORIDE 0.9 % IV BOLUS (SEPSIS)
1000.0000 mL | Freq: Once | INTRAVENOUS | Status: AC
Start: 2014-06-13 — End: 2014-06-13
  Administered 2014-06-13: 1000 mL via INTRAVENOUS

## 2014-06-13 MED ORDER — ONDANSETRON HCL 4 MG/2ML IJ SOLN
4.0000 mg | Freq: Once | INTRAMUSCULAR | Status: AC
  Administered 2014-06-13: 4 mg via INTRAVENOUS
  Filled 2014-06-13: qty 2

## 2014-06-13 MED ORDER — FAMOTIDINE IN NACL 20-0.9 MG/50ML-% IV SOLN
20.0000 mg | Freq: Once | INTRAVENOUS | Status: AC
  Administered 2014-06-13: 20 mg via INTRAVENOUS
  Filled 2014-06-13: qty 50

## 2014-06-13 MED ORDER — FAMOTIDINE 20 MG PO TABS: 20.0000 mg | ORAL_TABLET | Freq: Two times a day (BID) | ORAL | Status: DC

## 2014-06-13 NOTE — ED Notes (Signed)
Dr Cook at bedside

## 2014-06-13 NOTE — Discharge Instructions (Signed)
Abdominal Pain Many things can cause belly (abdominal) pain. Most times, the belly pain is not dangerous. Many cases of belly pain can be watched and treated at home. HOME CARE   Do not take medicines that help you go poop (laxatives) unless told to by your doctor.  Only take medicine as told by your doctor.  Eat or drink as told by your doctor. Your doctor will tell you if you should be on a special diet. GET HELP IF:  You do not know what is causing your belly pain.  You have belly pain while you are sick to your stomach (nauseous) or have runny poop (diarrhea).  You have pain while you pee or poop.  Your belly pain wakes you up at night.  You have belly pain that gets worse or better when you eat.  You have belly pain that gets worse when you eat fatty foods.  You have a fever. GET HELP RIGHT AWAY IF:   The pain does not go away within 2 hours.  You keep throwing up (vomiting).  The pain changes and is only in the right or left part of the belly.  You have bloody or tarry looking poop. MAKE SURE YOU:   Understand these instructions.  Will watch your condition.  Will get help right away if you are not doing well or get worse. Document Released: 12/24/2007 Document Revised: 07/12/2013 Document Reviewed: 03/16/2013 [facility] Patient Information 2015 [place], [place]. This information is not intended to replace advice given to you by your health care provider. Make sure you discuss any questions you have with your health care provider.  CT scan of abdomen showed no serious problems. New medication for ulcer and nausea. Follow-up your primary care doctor.

## 2014-06-13 NOTE — ED Notes (Signed)
Per wife patient has been "running a high fever" at night in which he has been taking tyenol. Unsure of temp. Wife states "He is very hot to touch and sweating."

## 2014-06-13 NOTE — ED Provider Notes (Signed)
CSN: [id number]     Arrival date & time 06/13/14  0804 History  This chart was scribed for Nat Christen, MD by Lowella Petties, ED Scribe. The patient was seen in room APA03/APA03. Patient's care was started at 8:31 AM.    Chief Complaint  Patient presents with  . Abdominal Pain   The history is provided by the patient. No language interpreter was used.    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 He reports an associated reduced appetite, and subjective fever and diaphoresis at night. He reports associated nausea. He reports a history of peptic ulcers, but states that this feels different. He denies vomiting. He additionally reports eye pain for the past two weeks for which he saw his eye doctor. He has been taking Cipro which was prescribed to him by his eye doctor. He reports a history of chronic back pain and a left knee replacement.   Past Medical History  Diagnosis Date  . Allergic rhinitis   . Hypertension     benign essentia  . Carpal tunnel syndrome   . Intervertebral disc disorder   . DM (diabetes mellitus)     family hx  . Obesity   . Peptic ulcer     remote past  . Hypercholesterolemia   . Asthma   . Depressive reaction   . Paroxysmal vertigo   . Anxiety   . Arthritis    Past Surgical History  Procedure Laterality Date  . Hand surgery      left  . Colonoscopy  09/11/2011    Procedure: COLONOSCOPY;  Surgeon: Daneil Dolin, MD;  Location: [facility];  Service: Endoscopy;  Laterality: N/A;  9:15   Family History  Problem Relation Age of Onset  . Colon cancer Paternal Uncle   . Pneumonia Father     died at age 50  . Heart disease    . Asthma    . Diabetes    . Hypertension Mother   . Hypertension Other    History  Substance Use Topics  . Smoking status: Never Smoker   . Smokeless tobacco: Never Used  .  Alcohol Use: Yes     Comment: glass of wine every now and then    Review of Systems  Constitutional: Positive for fever, diaphoresis and appetite change.  Eyes: Positive for pain.  Gastrointestinal: Positive for abdominal pain.   A complete 10 system review of systems was obtained and all systems are negative except as noted in the HPI and PMH.   Allergies  Latex  Home Medications   Prior to Admission medications   Medication Sig Start Date End Date Taking? Authorizing Provider  acetaminophen (TYLENOL) 160 MG/5ML liquid Take 320 mg by mouth 2 (two) times daily as needed for fever.   Yes Historical Provider, MD  albuterol (PROVENTIL) (2.5 MG/3ML) 0.083% nebulizer solution Take 2.5 mg by nebulization every 4 (four) hours as needed for wheezing or shortness of breath.  08/12/11  Yes Historical Provider, MD  beclomethasone (QVAR) 80 MCG/ACT inhaler Inhale 2 puffs into the lungs 2 (two) times daily as needed (shortness of breath).    Yes Historical Provider, MD  betamethasone dipropionate (DIPROLENE) 0.05 % cream Apply 1 application topically 2 (two) times daily as needed (itching).  04/14/14  Yes Historical Provider, MD  cetirizine (ZYRTEC) 10 MG tablet Take  1 tablet by mouth daily. 05/25/14  Yes Historical Provider, MD  ciprofloxacin (CIPRO) 500 MG tablet Take 1 tablet by mouth 2 (two) times daily. 06/05/14  Yes Historical Provider, MD  cyclobenzaprine (FLEXERIL) 10 MG tablet Take 1 tablet (10 mg total) by mouth 3 (three) times daily as needed for muscle spasms. 08/18/12  Yes Janice Norrie, MD  docusate sodium (COLACE) 100 MG capsule Take 200 mg by mouth daily as needed for mild constipation.   Yes Historical Provider, MD  fluticasone (FLONASE) 50 MCG/ACT nasal spray Place 1 spray into the nose daily.   Yes Historical Provider, MD  furosemide (LASIX) 40 MG tablet Take 1 tablet by mouth daily as needed for fluid.  06/05/14  Yes Historical Provider, MD  HYDROcodone-acetaminophen (NORCO) 10-325 MG  per tablet Take 1 tablet by mouth 3 (three) times daily as needed. 06/05/14  Yes Historical Provider, MD  ibuprofen (ADVIL,MOTRIN) 800 MG tablet Take 800 mg by mouth every 8 (eight) hours as needed. Pain/inflammation 07/30/11  Yes Historical Provider, MD  NEXIUM 40 MG capsule Take 40 mg by mouth daily before breakfast.  08/12/11  Yes Historical Provider, MD  polyethylene glycol powder (GLYCOLAX/MIRALAX) powder Take 17 g by mouth daily. Patient taking differently: Take 17 g by mouth daily as needed for mild constipation.  08/30/12  Yes Orvil Feil, NP  potassium chloride SA (K-DUR,KLOR-CON) 20 MEQ tablet Take 20 mEq by mouth every morning.  08/12/11  Yes Historical Provider, MD  valsartan-hydrochlorothiazide (DIOVAN-HCT) 160-12.5 MG per tablet Take 1 tablet by mouth daily.  08/12/11  Yes Historical Provider, MD  albuterol (PROVENTIL HFA;VENTOLIN HFA) 108 (90 BASE) MCG/ACT inhaler Inhale 2 puffs into the lungs every 4 (four) hours as needed for wheezing or shortness of breath (cough). 10/28/11 03/31/13  Charlena Cross, MD  albuterol (PROVENTIL) (2.5 MG/3ML) 0.083% nebulizer solution Take 3 mLs (2.5 mg total) by nebulization every 6 (six) hours as needed for wheezing. Patient not taking: Reported on 06/13/2014 03/31/13   Ashley Murrain, NP  famotidine (PEPCID) 20 MG tablet Take 1 tablet (20 mg total) by mouth 2 (two) times daily. 06/13/14   Nat Christen, MD  guaiFENesin-codeine [facility]) 100-10 MG/5ML syrup Take 5 mLs by mouth 3 (three) times daily as needed for cough. Patient not taking: Reported on 06/13/2014 03/31/13   Ashley Murrain, NP  HYDROcodone-acetaminophen (NORCO/VICODIN) 5-325 MG per tablet Take 1 tablet by mouth every 6 (six) hours as needed for moderate pain. Patient not taking: Reported on 06/13/2014 02/07/14   Carole Civil, MD  predniSONE (DELTASONE) 10 MG tablet Take 2 tablets (20 mg total) by mouth 2 (two) times daily. Patient not taking: Reported on 06/13/2014 03/31/13   Ashley Murrain, NP   promethazine (PHENERGAN) 25 MG tablet Take 1 tablet (25 mg total) by mouth every 6 (six) hours as needed. 06/13/14   Nat Christen, MD   Triage vitals: BP 134/67 mmHg  Pulse 95  Temp(Src) 98.1 F (36.7 C) (Oral)  Resp 20  Ht 6' (1.829 m)  Wt 277 lb (125.646 kg)  BMI 37.56 kg/m2  SpO2 94% Physical Exam  Constitutional: He is oriented to person, place, and time. He appears well-developed and well-nourished.  obese  HENT:  Head: Normocephalic and atraumatic.  Eyes: Conjunctivae and EOM are normal. Pupils are equal, round, and reactive to light.  Bilateral conjunctival injection.  Neck: Normal range of motion. Neck supple.  Cardiovascular: Normal rate, regular rhythm and normal heart sounds.   Pulmonary/Chest:  Effort normal and breath sounds normal.  Abdominal: Soft. Bowel sounds are normal. There is tenderness (upper, mid).  Musculoskeletal: Normal range of motion.  Neurological: He is alert and oriented to person, place, and time.  Skin: Skin is warm and dry.  Psychiatric: He has a normal mood and affect. His behavior is normal.  Nursing note and vitals reviewed.   ED Course  Procedures (including critical care time) DIAGNOSTIC STUDIES: Oxygen Saturation is 94% on room air, adequate by my interpretation.    COORDINATION OF CARE: 8:37 AM-Discussed treatment plan which includes abdominal CT-scan, and lab work with pt at bedside and pt agreed to plan.   Labs Review Labs Reviewed  COMPREHENSIVE METABOLIC PANEL - Abnormal; Notable for the following:    Sodium 134 (*)    Potassium 3.5 (*)    Chloride 92 (*)    Glucose, Bld 115 (*)    Creatinine, Ser 1.64 (*)    Albumin 3.0 (*)    AST 38 (*)    GFR calc non Af Amer 46 (*)    GFR calc Af Amer 53 (*)    All other components within normal limits  CBC WITH DIFFERENTIAL - Abnormal; Notable for the following:    WBC 10.7 (*)    Hemoglobin 12.7 (*)    HCT 36.8 (*)    Neutro Abs 7.8 (*)    Monocytes Absolute 1.2 (*)    All other  components within normal limits  URINALYSIS, ROUTINE W REFLEX MICROSCOPIC - Abnormal; Notable for the following:    Specific Gravity, Urine <1.005 (*)    Hgb urine dipstick SMALL (*)    All other components within normal limits  LIPASE, BLOOD  URINE MICROSCOPIC-ADD ON    Imaging Review Ct Abdomen Pelvis W Contrast  06/13/2014   CLINICAL DATA:  Upper abdominal pain for 8 days with fever, diaphoresis and nausea.  EXAM: CT ABDOMEN AND PELVIS WITH CONTRAST  TECHNIQUE: Multidetector CT imaging of the abdomen and pelvis was performed using the standard protocol following bolus administration of intravenous contrast.  CONTRAST:  34mL OMNIPAQUE IOHEXOL 300 MG/ML SOLN, 84mL OMNIPAQUE IOHEXOL 300 MG/ML SOLN  COMPARISON:  None.  FINDINGS: Lower chest: Lung bases show no acute findings. Heart size within normal limits. No pericardial or pleural effusion.  Hepatobiliary: Liver and gallbladder are unremarkable. No biliary ductal dilatation.  Pancreas: Negative.  Spleen: Negative.  Adrenals/Urinary Tract: Adrenal glands and kidneys are unremarkable. Ureters are decompressed. Bladder is low in volume, limiting evaluation.  Stomach/Bowel: Stomach, small bowel, appendix and colon are unremarkable.  Vascular/Lymphatic: Vascular structures are unremarkable. There are multiple scattered abdominal peritoneal ligament, mesenteric and retroperitoneal lymph nodes, measuring up to 10 mm in short axis in the right external iliac station (image 70). Inguinal lymph nodes measure up to 1.5 cm on the left.  Reproductive: Prostate is normal in size.  Other: No free fluid. Mild scattered haziness in the retroperitoneum, nonspecific. Mesenteries and peritoneum are unremarkable.  Musculoskeletal: No worrisome lytic or sclerotic lesions.  IMPRESSION: 1. No findings to explain the patient's symptoms. 2. Scattered small to borderline enlarged retroperitoneal and inguinal lymph nodes, nonspecific. Difficult to definitively exclude a  lymphoproliferative disorder.   Electronically Signed   By: Lorin Picket M.D.   On: 06/13/2014 10:30     EKG Interpretation None      MDM   Final diagnoses:  Abdominal pain   Patient feels better after IV fluids and pain management. Discussed results of CT scan including evidence of  borderline-enlarged retroperitoneal and inguinal lymph nodes. Discharge medications Phenergan 25 mg and Pepcid 20 mg. Follow-up with primary care doctor.  I personally performed the services described in this documentation, which was scribed in my presence. The recorded information has been reviewed and is accurate.    Nat Christen, MD 06/13/14 1321

## 2014-06-13 NOTE — ED Notes (Signed)
Patient c/o mid abd pain with lack appetite x8 days. Patient denies any nausea or vomiting. Patient does report some diarrhea but states "I have been taking some antibiotics that my doctor gave me for an upper respiratory infection. Patient also c/o bilateral eye pain and headache. Patient reports blurred vision and green colored drainage from both eyes. Sclera red, conjunctive pink.

## 2014-07-26 DIAGNOSIS — I10 Essential (primary) hypertension: Secondary | ICD-10-CM | POA: Diagnosis not present

## 2014-08-18 DIAGNOSIS — H6501 Acute serous otitis media, right ear: Secondary | ICD-10-CM | POA: Diagnosis not present

## 2014-08-18 DIAGNOSIS — H10023 Other mucopurulent conjunctivitis, bilateral: Secondary | ICD-10-CM | POA: Diagnosis not present

## 2014-08-18 DIAGNOSIS — H1033 Unspecified acute conjunctivitis, bilateral: Secondary | ICD-10-CM | POA: Diagnosis not present

## 2014-08-22 ENCOUNTER — Encounter: Payer: Self-pay | Admitting: Internal Medicine

## 2014-08-30 DIAGNOSIS — H1032 Unspecified acute conjunctivitis, left eye: Secondary | ICD-10-CM | POA: Diagnosis not present

## 2014-08-30 DIAGNOSIS — H1031 Unspecified acute conjunctivitis, right eye: Secondary | ICD-10-CM | POA: Diagnosis not present

## 2014-08-31 DIAGNOSIS — H20013 Primary iridocyclitis, bilateral: Secondary | ICD-10-CM | POA: Diagnosis not present

## 2014-09-12 DIAGNOSIS — H20013 Primary iridocyclitis, bilateral: Secondary | ICD-10-CM | POA: Diagnosis not present

## 2014-09-13 DIAGNOSIS — H9391 Unspecified disorder of right ear: Secondary | ICD-10-CM | POA: Diagnosis not present

## 2014-09-21 ENCOUNTER — Ambulatory Visit (INDEPENDENT_AMBULATORY_CARE_PROVIDER_SITE_OTHER): Payer: Commercial Managed Care - HMO | Admitting: Otolaryngology

## 2014-09-21 DIAGNOSIS — H903 Sensorineural hearing loss, bilateral: Secondary | ICD-10-CM | POA: Diagnosis not present

## 2014-09-21 DIAGNOSIS — H6983 Other specified disorders of Eustachian tube, bilateral: Secondary | ICD-10-CM

## 2014-09-26 DIAGNOSIS — H20013 Primary iridocyclitis, bilateral: Secondary | ICD-10-CM | POA: Diagnosis not present

## 2014-10-05 ENCOUNTER — Ambulatory Visit (INDEPENDENT_AMBULATORY_CARE_PROVIDER_SITE_OTHER): Payer: Commercial Managed Care - HMO | Admitting: Otolaryngology

## 2014-10-05 DIAGNOSIS — H903 Sensorineural hearing loss, bilateral: Secondary | ICD-10-CM | POA: Diagnosis not present

## 2014-10-05 DIAGNOSIS — H9121 Sudden idiopathic hearing loss, right ear: Secondary | ICD-10-CM

## 2014-10-06 DIAGNOSIS — E780 Pure hypercholesterolemia: Secondary | ICD-10-CM | POA: Diagnosis not present

## 2014-10-06 DIAGNOSIS — Z79891 Long term (current) use of opiate analgesic: Secondary | ICD-10-CM | POA: Diagnosis not present

## 2014-10-06 DIAGNOSIS — I10 Essential (primary) hypertension: Secondary | ICD-10-CM | POA: Diagnosis not present

## 2014-10-06 DIAGNOSIS — Z125 Encounter for screening for malignant neoplasm of prostate: Secondary | ICD-10-CM | POA: Diagnosis not present

## 2014-10-06 DIAGNOSIS — M545 Low back pain: Secondary | ICD-10-CM | POA: Diagnosis not present

## 2014-10-09 ENCOUNTER — Other Ambulatory Visit (INDEPENDENT_AMBULATORY_CARE_PROVIDER_SITE_OTHER): Payer: Self-pay | Admitting: Otolaryngology

## 2014-10-09 DIAGNOSIS — H918X9 Other specified hearing loss, unspecified ear: Secondary | ICD-10-CM

## 2014-10-09 DIAGNOSIS — R42 Dizziness and giddiness: Secondary | ICD-10-CM

## 2014-10-12 ENCOUNTER — Ambulatory Visit (INDEPENDENT_AMBULATORY_CARE_PROVIDER_SITE_OTHER): Payer: Commercial Managed Care - HMO | Admitting: Otolaryngology

## 2014-10-12 DIAGNOSIS — H903 Sensorineural hearing loss, bilateral: Secondary | ICD-10-CM | POA: Diagnosis not present

## 2014-10-12 DIAGNOSIS — H9121 Sudden idiopathic hearing loss, right ear: Secondary | ICD-10-CM | POA: Diagnosis not present

## 2014-10-17 ENCOUNTER — Ambulatory Visit (HOSPITAL_COMMUNITY)
Admission: RE | Admit: 2014-10-17 | Discharge: 2014-10-17 | Disposition: A | Discharge status: Home / Self Care | Payer: Commercial Managed Care - HMO | Source: Ambulatory Visit | Attending: Otolaryngology | Admitting: Otolaryngology

## 2014-10-17 DIAGNOSIS — H918X9 Other specified hearing loss, unspecified ear: Secondary | ICD-10-CM

## 2014-10-17 DIAGNOSIS — R42 Dizziness and giddiness: Secondary | ICD-10-CM

## 2014-11-07 ENCOUNTER — Other Ambulatory Visit (INDEPENDENT_AMBULATORY_CARE_PROVIDER_SITE_OTHER): Payer: Self-pay | Admitting: Otolaryngology

## 2014-11-07 DIAGNOSIS — H9193 Unspecified hearing loss, bilateral: Secondary | ICD-10-CM

## 2014-11-08 DIAGNOSIS — E780 Pure hypercholesterolemia: Secondary | ICD-10-CM | POA: Diagnosis not present

## 2014-11-08 DIAGNOSIS — I10 Essential (primary) hypertension: Secondary | ICD-10-CM | POA: Diagnosis not present

## 2014-11-08 DIAGNOSIS — E6609 Other obesity due to excess calories: Secondary | ICD-10-CM | POA: Diagnosis not present

## 2014-11-09 ENCOUNTER — Other Ambulatory Visit (INDEPENDENT_AMBULATORY_CARE_PROVIDER_SITE_OTHER): Payer: Self-pay | Admitting: Otolaryngology

## 2014-11-09 DIAGNOSIS — R42 Dizziness and giddiness: Secondary | ICD-10-CM

## 2014-11-09 DIAGNOSIS — H918X9 Other specified hearing loss, unspecified ear: Secondary | ICD-10-CM

## 2014-11-10 ENCOUNTER — Ambulatory Visit (HOSPITAL_COMMUNITY): Payer: Commercial Managed Care - HMO

## 2014-11-15 ENCOUNTER — Ambulatory Visit (HOSPITAL_COMMUNITY)
Admission: RE | Admit: 2014-11-15 | Discharge: 2014-11-15 | Disposition: A | Discharge status: Home / Self Care | Payer: Commercial Managed Care - HMO | Source: Ambulatory Visit | Attending: Otolaryngology | Admitting: Otolaryngology

## 2014-11-15 ENCOUNTER — Encounter (HOSPITAL_COMMUNITY): Payer: Self-pay

## 2014-11-15 DIAGNOSIS — R42 Dizziness and giddiness: Secondary | ICD-10-CM | POA: Insufficient documentation

## 2014-11-15 DIAGNOSIS — H918X9 Other specified hearing loss, unspecified ear: Secondary | ICD-10-CM | POA: Diagnosis not present

## 2014-11-15 DIAGNOSIS — H9191 Unspecified hearing loss, right ear: Secondary | ICD-10-CM | POA: Diagnosis not present

## 2014-11-15 MED ORDER — IOHEXOL 300 MG/ML  SOLN
75.0000 mL | Freq: Once | INTRAMUSCULAR | Status: AC | PRN
  Administered 2014-11-15: 75 mL via INTRAVENOUS

## 2014-12-07 ENCOUNTER — Ambulatory Visit (INDEPENDENT_AMBULATORY_CARE_PROVIDER_SITE_OTHER): Payer: Commercial Managed Care - HMO | Admitting: Otolaryngology

## 2014-12-19 DIAGNOSIS — M25562 Pain in left knee: Secondary | ICD-10-CM | POA: Diagnosis not present

## 2014-12-19 DIAGNOSIS — I952 Hypotension due to drugs: Secondary | ICD-10-CM | POA: Diagnosis not present

## 2014-12-19 DIAGNOSIS — M545 Low back pain: Secondary | ICD-10-CM | POA: Diagnosis not present

## 2014-12-19 DIAGNOSIS — I10 Essential (primary) hypertension: Secondary | ICD-10-CM | POA: Diagnosis not present

## 2015-01-04 ENCOUNTER — Ambulatory Visit (INDEPENDENT_AMBULATORY_CARE_PROVIDER_SITE_OTHER): Payer: Commercial Managed Care - HMO | Admitting: Otolaryngology

## 2015-03-09 DIAGNOSIS — Z79891 Long term (current) use of opiate analgesic: Secondary | ICD-10-CM | POA: Diagnosis not present

## 2015-03-09 DIAGNOSIS — M25562 Pain in left knee: Secondary | ICD-10-CM | POA: Diagnosis not present

## 2015-03-09 DIAGNOSIS — R4181 Age-related cognitive decline: Secondary | ICD-10-CM | POA: Diagnosis not present

## 2015-03-09 DIAGNOSIS — M545 Low back pain: Secondary | ICD-10-CM | POA: Diagnosis not present

## 2015-03-28 ENCOUNTER — Encounter (HOSPITAL_COMMUNITY): Payer: Self-pay | Admitting: *Deleted

## 2015-03-28 ENCOUNTER — Emergency Department (HOSPITAL_COMMUNITY): Payer: Commercial Managed Care - HMO

## 2015-03-28 ENCOUNTER — Emergency Department (HOSPITAL_COMMUNITY)
Admission: EM | Admit: 2015-03-28 | Discharge: 2015-03-28 | Disposition: A | Discharge status: Home / Self Care | Payer: Commercial Managed Care - HMO | Attending: Emergency Medicine | Admitting: Emergency Medicine

## 2015-03-28 DIAGNOSIS — Z8719 Personal history of other diseases of the digestive system: Secondary | ICD-10-CM | POA: Insufficient documentation

## 2015-03-28 DIAGNOSIS — Z8659 Personal history of other mental and behavioral disorders: Secondary | ICD-10-CM | POA: Insufficient documentation

## 2015-03-28 DIAGNOSIS — Z7951 Long term (current) use of inhaled steroids: Secondary | ICD-10-CM | POA: Diagnosis not present

## 2015-03-28 DIAGNOSIS — J45909 Unspecified asthma, uncomplicated: Secondary | ICD-10-CM | POA: Diagnosis not present

## 2015-03-28 DIAGNOSIS — Z792 Long term (current) use of antibiotics: Secondary | ICD-10-CM | POA: Diagnosis not present

## 2015-03-28 DIAGNOSIS — E669 Obesity, unspecified: Secondary | ICD-10-CM | POA: Diagnosis not present

## 2015-03-28 DIAGNOSIS — Z79899 Other long term (current) drug therapy: Secondary | ICD-10-CM | POA: Insufficient documentation

## 2015-03-28 DIAGNOSIS — M1990 Unspecified osteoarthritis, unspecified site: Secondary | ICD-10-CM | POA: Diagnosis not present

## 2015-03-28 DIAGNOSIS — M722 Plantar fascial fibromatosis: Secondary | ICD-10-CM | POA: Diagnosis not present

## 2015-03-28 DIAGNOSIS — I10 Essential (primary) hypertension: Secondary | ICD-10-CM | POA: Diagnosis not present

## 2015-03-28 DIAGNOSIS — Z91040 Latex allergy status: Secondary | ICD-10-CM | POA: Diagnosis not present

## 2015-03-28 DIAGNOSIS — M25572 Pain in left ankle and joints of left foot: Secondary | ICD-10-CM | POA: Diagnosis not present

## 2015-03-28 LAB — CBG MONITORING, ED: GLUCOSE-CAPILLARY: 91 mg/dL (ref 65–99)

## 2015-03-28 MED ORDER — DICLOFENAC SODIUM 75 MG PO TBEC: 75.0000 mg | DELAYED_RELEASE_TABLET | Freq: Two times a day (BID) | ORAL | Status: DC

## 2015-03-28 MED ORDER — HYDROCODONE-ACETAMINOPHEN 5-325 MG PO TABS: ORAL_TABLET | ORAL | Status: DC

## 2015-03-28 NOTE — ED Notes (Signed)
Left ankle pain x 1 week. Unsure of any injury.

## 2015-03-28 NOTE — Discharge Instructions (Signed)
Plantar Fasciitis  Plantar fasciitis is a common condition that causes foot pain. It is soreness (inflammation) of the band of tough fibrous tissue on the bottom of the foot that runs from the heel bone (calcaneus) to the ball of the foot. The cause of this soreness may be from excessive standing, poor fitting shoes, running on hard surfaces, being overweight, having an abnormal walk, or overuse (this is common in runners) of the painful foot or feet. It is also common in aerobic exercise dancers and ballet dancers.  SYMPTOMS   Most people with plantar fasciitis complain of:   Severe pain in the morning on the bottom of their foot especially when taking the first steps out of bed. This pain recedes after a few minutes of walking.   Severe pain is experienced also during walking following a long period of inactivity.   Pain is worse when walking barefoot or up stairs  DIAGNOSIS    Your caregiver will diagnose this condition by examining and feeling your foot.   Special tests such as X-rays of your foot, are usually not needed.  PREVENTION    Consult a sports medicine professional before beginning a new exercise program.   Walking programs offer a good workout. With walking there is a lower chance of overuse injuries common to runners. There is less impact and less jarring of the joints.   Begin all new exercise programs slowly. If problems or pain develop, decrease the amount of time or distance until you are at a comfortable level.   Wear good shoes and replace them regularly.   Stretch your foot and the heel cords at the back of the ankle (Achilles tendon) both before and after exercise.   Run or exercise on even surfaces that are not hard. For example, asphalt is better than pavement.   Do not run barefoot on hard surfaces.   If using a treadmill, vary the incline.   Do not continue to workout if you have foot or joint problems. Seek professional help if they do not improve.  HOME CARE INSTRUCTIONS     Avoid activities that cause you pain until you recover.   Use ice or cold packs on the problem or painful areas after working out.   Only take over-the-counter or prescription medicines for pain, discomfort, or fever as directed by your caregiver.   Soft shoe inserts or athletic shoes with air or gel sole cushions may be helpful.   If problems continue or become more severe, consult a sports medicine caregiver or your own health care provider. Cortisone is a potent anti-inflammatory medication that may be injected into the painful area. You can discuss this treatment with your caregiver.  MAKE SURE YOU:    Understand these instructions.   Will watch your condition.   Will get help right away if you are not doing well or get worse.  Document Released: 04/01/2001 Document Revised: 09/29/2011 Document Reviewed: 05/31/2008  ExitCare Patient Information 2015 [employer]. This information is not intended to replace advice given to you by your health care provider. Make sure you discuss any questions you have with your health care provider.

## 2015-03-29 NOTE — ED Provider Notes (Signed)
CSN: [id number]     Arrival date & time 03/28/15  1156 History   First MD Initiated Contact with Patient 03/28/15 1222     Chief Complaint  Patient presents with  . Ankle Pain     (Consider location/radiation/quality/duration/timing/severity/associated sxs/prior Treatment) HPI   Parker Rodriguez is a 54 y.o. male who presents to the Emergency Department complaining of left ankle pain for one week.  Pain is worse at the ankle and posterior foot.  Worse with weight bearing.  He states he is unsure if he may have injured his foot.  He doesn't recall a fall.  He denies leg pain, pain to the anterior foot,  numbness or weakness.     Past Medical History  Diagnosis Date  . Allergic rhinitis   . Hypertension     benign essentia  . Carpal tunnel syndrome   . Intervertebral disc disorder   . Obesity   . Peptic ulcer     remote past  . Hypercholesterolemia   . Asthma   . Depressive reaction   . Paroxysmal vertigo   . Anxiety   . Arthritis    Past Surgical History  Procedure Laterality Date  . Hand surgery      left  . Colonoscopy  09/11/2011    Procedure: COLONOSCOPY;  Surgeon: Daneil Dolin, MD;  Location: [facility];  Service: Endoscopy;  Laterality: N/A;  9:15   Family History  Problem Relation Age of Onset  . Colon cancer Paternal Uncle   . Pneumonia Father     died at age 28  . Heart disease    . Asthma    . Diabetes    . Hypertension Mother   . Hypertension Other    Social History  Substance Use Topics  . Smoking status: Never Smoker   . Smokeless tobacco: Never Used  . Alcohol Use: Yes     Comment: glass of wine every now and then    Review of Systems  Constitutional: Negative for fever and chills.  Musculoskeletal: Positive for arthralgias. Negative for back pain and joint swelling.  Skin: Negative for color change and wound.  Neurological: Negative for dizziness, weakness and numbness.  All other systems reviewed and are negative.     Allergies   Latex  Home Medications   Prior to Admission medications   Medication Sig Start Date End Date Taking? Authorizing Provider  acetaminophen (TYLENOL) 160 MG/5ML liquid Take 320 mg by mouth 2 (two) times daily as needed for fever.    Historical Provider, MD  albuterol (PROVENTIL HFA;VENTOLIN HFA) 108 (90 BASE) MCG/ACT inhaler Inhale 2 puffs into the lungs every 4 (four) hours as needed for wheezing or shortness of breath (cough). 10/28/11 03/31/13  Charlena Cross, MD  albuterol (PROVENTIL) (2.5 MG/3ML) 0.083% nebulizer solution Take 2.5 mg by nebulization every 4 (four) hours as needed for wheezing or shortness of breath.  08/12/11   Historical Provider, MD  albuterol (PROVENTIL) (2.5 MG/3ML) 0.083% nebulizer solution Take 3 mLs (2.5 mg total) by nebulization every 6 (six) hours as needed for wheezing. Patient not taking: Reported on 06/13/2014 03/31/13   Ashley Murrain, NP  beclomethasone (QVAR) 80 MCG/ACT inhaler Inhale 2 puffs into the lungs 2 (two) times daily as needed (shortness of breath).     Historical Provider, MD  betamethasone dipropionate (DIPROLENE) 0.05 % cream Apply 1 application topically 2 (two) times daily as needed (itching).  04/14/14   Historical Provider, MD  cetirizine (ZYRTEC) 10  MG tablet Take 1 tablet by mouth daily. 05/25/14   Historical Provider, MD  ciprofloxacin (CIPRO) 500 MG tablet Take 1 tablet by mouth 2 (two) times daily. 06/05/14   Historical Provider, MD  cyclobenzaprine (FLEXERIL) 10 MG tablet Take 1 tablet (10 mg total) by mouth 3 (three) times daily as needed for muscle spasms. 08/18/12   Rolland Porter, MD  diclofenac (VOLTAREN) 75 MG EC tablet Take 1 tablet (75 mg total) by mouth 2 (two) times daily. Take with food 03/28/15   Kevina Piloto, PA-C  docusate sodium (COLACE) 100 MG capsule Take 200 mg by mouth daily as needed for mild constipation.    Historical Provider, MD  famotidine (PEPCID) 20 MG tablet Take 1 tablet (20 mg total) by mouth 2 (two) times daily. 06/13/14    Nat Christen, MD  fluticasone (FLONASE) 50 MCG/ACT nasal spray Place 1 spray into the nose daily.    Historical Provider, MD  furosemide (LASIX) 40 MG tablet Take 1 tablet by mouth daily as needed for fluid.  06/05/14   Historical Provider, MD  HYDROcodone-acetaminophen (NORCO/VICODIN) 5-325 MG per tablet Take one-two tabs po q 4-6 hrs prn pain 03/28/15   Miki Labuda, PA-C  ibuprofen (ADVIL,MOTRIN) 800 MG tablet Take 800 mg by mouth every 8 (eight) hours as needed. Pain/inflammation 07/30/11   Historical Provider, MD  NEXIUM 40 MG capsule Take 40 mg by mouth daily before breakfast.  08/12/11   Historical Provider, MD  polyethylene glycol powder (GLYCOLAX/MIRALAX) powder Take 17 g by mouth daily. Patient taking differently: Take 17 g by mouth daily as needed for mild constipation.  08/30/12   Orvil Feil, NP  potassium chloride SA (K-DUR,KLOR-CON) 20 MEQ tablet Take 20 mEq by mouth every morning.  08/12/11   Historical Provider, MD  predniSONE (DELTASONE) 10 MG tablet Take 2 tablets (20 mg total) by mouth 2 (two) times daily. Patient not taking: Reported on 06/13/2014 03/31/13   Ashley Murrain, NP  promethazine (PHENERGAN) 25 MG tablet Take 1 tablet (25 mg total) by mouth every 6 (six) hours as needed. 06/13/14   Nat Christen, MD  valsartan-hydrochlorothiazide (DIOVAN-HCT) 160-12.5 MG per tablet Take 1 tablet by mouth daily.  08/12/11   Historical Provider, MD   BP 113/74 mmHg  Pulse 63  Temp(Src) 98.4 F (36.9 C) (Oral)  Resp 16  Ht 5\' 11"  (1.803 m)  Wt 258 lb (117.028 kg)  BMI 36.00 kg/m2  SpO2 99% Physical Exam  Constitutional: He is oriented to person, place, and time. He appears well-developed and well-nourished. No distress.  HENT:  Head: Normocephalic and atraumatic.  Cardiovascular: Normal rate, regular rhythm, normal heart sounds and intact distal pulses.   Pulmonary/Chest: Effort normal and breath sounds normal. No respiratory distress.  Musculoskeletal: He exhibits tenderness. He exhibits  no edema.  Tender to posterior left foot and ankle. ROM is preserved.  DP pulse is brisk,distal sensation intact.  No erythema, edema, abrasion, bruising or bony deformity.  No proximal tenderness.  Neurological: He is alert and oriented to person, place, and time. He exhibits normal muscle tone. Coordination normal.  Skin: Skin is warm and dry.  Nursing note and vitals reviewed.   ED Course  Procedures (including critical care time) Labs Review Labs Reviewed  CBG MONITORING, ED    Imaging Review Dg Ankle Complete Left  03/28/2015   CLINICAL DATA:  Left ankle pain for 1 week.  No injury.  EXAM: LEFT ANKLE COMPLETE - 3+ VIEW  COMPARISON:  January 02, 2014  FINDINGS: There is no evidence of fracture, dislocation, or joint effusion. There is no evidence of arthropathy or other focal bone abnormality. Soft tissues are unremarkable.  IMPRESSION: Negative.   Electronically Signed   By: Abelardo Diesel M.D.   On: 03/28/2015 12:57   I have personally reviewed and evaluated these images and lab results as part of my medical decision-making.   EKG Interpretation None      MDM   Final diagnoses:  Plantar fasciitis of left foot    Pt with ankle pain, neg for acute injury.  Possible plantar fasciitis.  Pt given aso.  Referral to podiatry.  He agrees to plan.      Kem Parkinson, PA-C 03/29/15 2149  Orpah Greek, MD 04/02/15 [phone]

## 2015-05-04 ENCOUNTER — Encounter (HOSPITAL_COMMUNITY): Payer: Self-pay | Admitting: Emergency Medicine

## 2015-05-04 ENCOUNTER — Emergency Department (HOSPITAL_COMMUNITY)
Admission: EM | Admit: 2015-05-04 | Discharge: 2015-05-04 | Disposition: A | Discharge status: Home / Self Care | Payer: Commercial Managed Care - HMO | Attending: Emergency Medicine | Admitting: Emergency Medicine

## 2015-05-04 DIAGNOSIS — Z8659 Personal history of other mental and behavioral disorders: Secondary | ICD-10-CM | POA: Diagnosis not present

## 2015-05-04 DIAGNOSIS — Z91040 Latex allergy status: Secondary | ICD-10-CM | POA: Insufficient documentation

## 2015-05-04 DIAGNOSIS — Z79899 Other long term (current) drug therapy: Secondary | ICD-10-CM | POA: Diagnosis not present

## 2015-05-04 DIAGNOSIS — H748X2 Other specified disorders of left middle ear and mastoid: Secondary | ICD-10-CM | POA: Diagnosis not present

## 2015-05-04 DIAGNOSIS — J45909 Unspecified asthma, uncomplicated: Secondary | ICD-10-CM | POA: Insufficient documentation

## 2015-05-04 DIAGNOSIS — Z8739 Personal history of other diseases of the musculoskeletal system and connective tissue: Secondary | ICD-10-CM | POA: Diagnosis not present

## 2015-05-04 DIAGNOSIS — J0100 Acute maxillary sinusitis, unspecified: Secondary | ICD-10-CM | POA: Diagnosis not present

## 2015-05-04 DIAGNOSIS — Z792 Long term (current) use of antibiotics: Secondary | ICD-10-CM | POA: Diagnosis not present

## 2015-05-04 DIAGNOSIS — Z8711 Personal history of peptic ulcer disease: Secondary | ICD-10-CM | POA: Insufficient documentation

## 2015-05-04 DIAGNOSIS — M1990 Unspecified osteoarthritis, unspecified site: Secondary | ICD-10-CM | POA: Diagnosis not present

## 2015-05-04 DIAGNOSIS — I10 Essential (primary) hypertension: Secondary | ICD-10-CM | POA: Diagnosis not present

## 2015-05-04 DIAGNOSIS — E669 Obesity, unspecified: Secondary | ICD-10-CM | POA: Diagnosis not present

## 2015-05-04 DIAGNOSIS — Z7951 Long term (current) use of inhaled steroids: Secondary | ICD-10-CM | POA: Insufficient documentation

## 2015-05-04 DIAGNOSIS — Z791 Long term (current) use of non-steroidal anti-inflammatories (NSAID): Secondary | ICD-10-CM | POA: Diagnosis not present

## 2015-05-04 DIAGNOSIS — J029 Acute pharyngitis, unspecified: Secondary | ICD-10-CM | POA: Diagnosis present

## 2015-05-04 DIAGNOSIS — Z8669 Personal history of other diseases of the nervous system and sense organs: Secondary | ICD-10-CM | POA: Diagnosis not present

## 2015-05-04 DIAGNOSIS — H748X1 Other specified disorders of right middle ear and mastoid: Secondary | ICD-10-CM | POA: Insufficient documentation

## 2015-05-04 MED ORDER — MAGIC MOUTHWASH W/LIDOCAINE: 5.0000 mL | Freq: Three times a day (TID) | ORAL | Status: DC | PRN

## 2015-05-04 MED ORDER — AMOXICILLIN 500 MG PO CAPS: 500.0000 mg | ORAL_CAPSULE | Freq: Three times a day (TID) | ORAL | Status: DC

## 2015-05-04 NOTE — ED Provider Notes (Signed)
CSN: [id number]     Arrival date & time 05/04/15  1546 History   First MD Initiated Contact with Patient 05/04/15 1616     Chief Complaint  Patient presents with  . Sore Throat     (Consider location/radiation/quality/duration/timing/severity/associated sxs/prior Treatment) HPI   Parker Rodriguez is a 54 y.o. male who presents to the Emergency Department complaining of sore throat, sinus pressure and nasal congestion for  2 days.  He states that he noticed facial pressure and pain with a "stuffy nose" at onset and then drainage to his throat and hoarseness of his voice.  He has tried warm fluids without relief.  He also states that he is spitting up and coughing up dark mucus.  Facial pressure is worse with bending over.  He denies fever, chills, shortness of breath, N/V, neck pain, and facial swelling.     Past Medical History  Diagnosis Date  . Allergic rhinitis   . Hypertension     benign essentia  . Carpal tunnel syndrome   . Intervertebral disc disorder   . Obesity   . Peptic ulcer     remote past  . Hypercholesterolemia   . Asthma   . Depressive reaction   . Paroxysmal vertigo   . Anxiety   . Arthritis    Past Surgical History  Procedure Laterality Date  . Hand surgery      left  . Colonoscopy  09/11/2011    Procedure: COLONOSCOPY;  Surgeon: Daneil Dolin, MD;  Location: [facility];  Service: Endoscopy;  Laterality: N/A;  9:15   Family History  Problem Relation Age of Onset  . Colon cancer Paternal Uncle   . Pneumonia Father     died at age 38  . Heart disease    . Asthma    . Diabetes    . Hypertension Mother   . Hypertension Other    Social History  Substance Use Topics  . Smoking status: Never Smoker   . Smokeless tobacco: Never Used  . Alcohol Use: Yes     Comment: glass of wine every now and then    Review of Systems  Constitutional: Negative for fever, chills, activity change and appetite change.  HENT: Positive for congestion, sinus  pressure, sore throat and voice change. Negative for dental problem, ear pain, facial swelling and trouble swallowing.   Eyes: Negative for pain and visual disturbance.  Respiratory: Positive for cough. Negative for chest tightness and shortness of breath.   Cardiovascular: Negative for chest pain.  Gastrointestinal: Negative for nausea, vomiting and abdominal pain.  Musculoskeletal: Negative for arthralgias, neck pain and neck stiffness.  Skin: Negative for color change and rash.  Neurological: Negative for dizziness, facial asymmetry, speech difficulty, numbness and headaches.  Hematological: Negative for adenopathy.  All other systems reviewed and are negative.     Allergies  Latex  Home Medications   Prior to Admission medications   Medication Sig Start Date End Date Taking? Authorizing Provider  acetaminophen (TYLENOL) 160 MG/5ML liquid Take 320 mg by mouth 2 (two) times daily as needed for fever.    Historical Provider, MD  albuterol (PROVENTIL HFA;VENTOLIN HFA) 108 (90 BASE) MCG/ACT inhaler Inhale 2 puffs into the lungs every 4 (four) hours as needed for wheezing or shortness of breath (cough). 10/28/11 03/31/13  Charlena Cross, MD  albuterol (PROVENTIL) (2.5 MG/3ML) 0.083% nebulizer solution Take 2.5 mg by nebulization every 4 (four) hours as needed for wheezing or shortness of breath.  08/12/11   Historical Provider, MD  albuterol (PROVENTIL) (2.5 MG/3ML) 0.083% nebulizer solution Take 3 mLs (2.5 mg total) by nebulization every 6 (six) hours as needed for wheezing. Patient not taking: Reported on 06/13/2014 03/31/13   Ashley Murrain, NP  beclomethasone (QVAR) 80 MCG/ACT inhaler Inhale 2 puffs into the lungs 2 (two) times daily as needed (shortness of breath).     Historical Provider, MD  betamethasone dipropionate (DIPROLENE) 0.05 % cream Apply 1 application topically 2 (two) times daily as needed (itching).  04/14/14   Historical Provider, MD  cetirizine (ZYRTEC) 10 MG tablet Take 1  tablet by mouth daily. 05/25/14   Historical Provider, MD  ciprofloxacin (CIPRO) 500 MG tablet Take 1 tablet by mouth 2 (two) times daily. 06/05/14   Historical Provider, MD  cyclobenzaprine (FLEXERIL) 10 MG tablet Take 1 tablet (10 mg total) by mouth 3 (three) times daily as needed for muscle spasms. 08/18/12   Rolland Porter, MD  diclofenac (VOLTAREN) 75 MG EC tablet Take 1 tablet (75 mg total) by mouth 2 (two) times daily. Take with food 03/28/15   Kae Lauman, PA-C  docusate sodium (COLACE) 100 MG capsule Take 200 mg by mouth daily as needed for mild constipation.    Historical Provider, MD  famotidine (PEPCID) 20 MG tablet Take 1 tablet (20 mg total) by mouth 2 (two) times daily. 06/13/14   Nat Christen, MD  fluticasone (FLONASE) 50 MCG/ACT nasal spray Place 1 spray into the nose daily.    Historical Provider, MD  furosemide (LASIX) 40 MG tablet Take 1 tablet by mouth daily as needed for fluid.  06/05/14   Historical Provider, MD  HYDROcodone-acetaminophen (NORCO/VICODIN) 5-325 MG per tablet Take one-two tabs po q 4-6 hrs prn pain 03/28/15   Lula Michaux, PA-C  ibuprofen (ADVIL,MOTRIN) 800 MG tablet Take 800 mg by mouth every 8 (eight) hours as needed. Pain/inflammation 07/30/11   Historical Provider, MD  NEXIUM 40 MG capsule Take 40 mg by mouth daily before breakfast.  08/12/11   Historical Provider, MD  polyethylene glycol powder (GLYCOLAX/MIRALAX) powder Take 17 g by mouth daily. Patient taking differently: Take 17 g by mouth daily as needed for mild constipation.  08/30/12   Orvil Feil, NP  potassium chloride SA (K-DUR,KLOR-CON) 20 MEQ tablet Take 20 mEq by mouth every morning.  08/12/11   Historical Provider, MD  predniSONE (DELTASONE) 10 MG tablet Take 2 tablets (20 mg total) by mouth 2 (two) times daily. Patient not taking: Reported on 06/13/2014 03/31/13   Ashley Murrain, NP  promethazine (PHENERGAN) 25 MG tablet Take 1 tablet (25 mg total) by mouth every 6 (six) hours as needed. 06/13/14   Nat Christen,  MD  valsartan-hydrochlorothiazide (DIOVAN-HCT) 160-12.5 MG per tablet Take 1 tablet by mouth daily.  08/12/11   Historical Provider, MD   BP 134/61 mmHg  Pulse 80  Temp(Src) 98.2 F (36.8 C) (Oral)  Resp 16  Ht 5\' 11"  (1.803 m)  Wt 255 lb (115.667 kg)  BMI 35.58 kg/m2  SpO2 100% Physical Exam  Constitutional: He is oriented to person, place, and time. He appears well-developed and well-nourished. No distress.  HENT:  Head: Normocephalic and atraumatic.  Right Ear: Ear canal normal. A middle ear effusion is present.  Left Ear: Ear canal normal. A middle ear effusion is present.  Nose: Mucosal edema present. Right sinus exhibits maxillary sinus tenderness. Left sinus exhibits maxillary sinus tenderness.  Mouth/Throat: Uvula is midline and mucous membranes are normal. No trismus  in the jaw. No uvula swelling. Posterior oropharyngeal erythema present. No oropharyngeal exudate, posterior oropharyngeal edema or tonsillar abscesses.  Mild middle ear effusion bilaterally, no erythema or bulging  Eyes: Conjunctivae are normal.  Neck: Normal range of motion. Neck supple.  Cardiovascular: Normal rate, regular rhythm, normal heart sounds and intact distal pulses.   Pulmonary/Chest: Effort normal and breath sounds normal. No stridor. No respiratory distress.  Abdominal: Soft. He exhibits no distension. There is no splenomegaly. There is no tenderness.  Musculoskeletal: Normal range of motion.  Lymphadenopathy:    He has no cervical adenopathy.  Neurological: He is alert and oriented to person, place, and time.  Skin: Skin is warm and dry.  Nursing note and vitals reviewed.   ED Course  Procedures (including critical care time) Labs Review Labs Reviewed - No data to display  Imaging Review No results found. I have personally reviewed and evaluated these images and lab results as part of my medical decision-making.   EKG Interpretation None      MDM   Final diagnoses:  Acute  maxillary sinusitis, recurrence not specified    Pt is well appearing, non-toxic.  Airway patent, no concerning sx's for tonsillar abscess.  Sx's c/w sinusitis.  He agrees to tx with amoxil, magic mouthwash and will continue his flonase.  Advised to f/u with PMD if needed.  Pt stable for d/c    Kem Parkinson, PA-C 05/05/15 [place], MD 05/05/15 [id number]

## 2015-05-04 NOTE — Discharge Instructions (Signed)
Sinusitis, Adult  Sinusitis is redness, soreness, and puffiness (inflammation) of the air pockets in the bones of your face (sinuses). The redness, soreness, and puffiness can cause air and mucus to get trapped in your sinuses. This can allow germs to grow and cause an infection.   HOME CARE    Drink enough fluids to keep your pee (urine) clear or pale yellow.   Use a humidifier in your home.   Run a hot shower to create steam in the bathroom. Sit in the bathroom with the door closed. Breathe in the steam 3-4 times a day.   Put a warm, moist washcloth on your face 3-4 times a day, or as told by your doctor.   Use salt water sprays (saline sprays) to wet the thick fluid in your nose. This can help the sinuses drain.   Only take medicine as told by your doctor.  GET HELP RIGHT AWAY IF:    Your pain gets worse.   You have very bad headaches.   You are sick to your stomach (nauseous).   You throw up (vomit).   You are very sleepy (drowsy) all the time.   Your face is puffy (swollen).   Your vision changes.   You have a stiff neck.   You have trouble breathing.  MAKE SURE YOU:    Understand these instructions.   Will watch your condition.   Will get help right away if you are not doing well or get worse.     This information is not intended to replace advice given to you by your health care provider. Make sure you discuss any questions you have with your health care provider.     Document Released: 12/24/2007 Document Revised: 07/28/2014 Document Reviewed: 02/10/2012  Elsevier Interactive Patient Education 2016 [employer]

## 2015-05-04 NOTE — ED Notes (Signed)
Pt states that he developed a sore throat with hoarseness 2 days ago.  Has not had any fever.

## 2015-05-28 DIAGNOSIS — Z23 Encounter for immunization: Secondary | ICD-10-CM | POA: Diagnosis not present

## 2015-05-28 DIAGNOSIS — E6609 Other obesity due to excess calories: Secondary | ICD-10-CM | POA: Diagnosis not present

## 2015-05-28 DIAGNOSIS — Z79891 Long term (current) use of opiate analgesic: Secondary | ICD-10-CM | POA: Diagnosis not present

## 2015-05-28 DIAGNOSIS — M545 Low back pain: Secondary | ICD-10-CM | POA: Diagnosis not present

## 2015-05-29 ENCOUNTER — Other Ambulatory Visit (HOSPITAL_COMMUNITY): Payer: Self-pay | Admitting: Family Medicine

## 2015-05-29 DIAGNOSIS — I83812 Varicose veins of left lower extremities with pain: Secondary | ICD-10-CM

## 2015-06-01 ENCOUNTER — Ambulatory Visit (HOSPITAL_COMMUNITY)
Admission: RE | Admit: 2015-06-01 | Discharge: 2015-06-01 | Disposition: A | Discharge status: Home / Self Care | Payer: Commercial Managed Care - HMO | Source: Ambulatory Visit | Attending: Family Medicine | Admitting: Family Medicine

## 2015-06-01 DIAGNOSIS — I83812 Varicose veins of left lower extremities with pain: Secondary | ICD-10-CM | POA: Diagnosis not present

## 2015-06-01 DIAGNOSIS — M79662 Pain in left lower leg: Secondary | ICD-10-CM | POA: Diagnosis not present

## 2015-06-01 DIAGNOSIS — M79605 Pain in left leg: Secondary | ICD-10-CM | POA: Diagnosis not present

## 2015-06-19 DIAGNOSIS — H6691 Otitis media, unspecified, right ear: Secondary | ICD-10-CM | POA: Diagnosis not present

## 2015-06-19 DIAGNOSIS — M25562 Pain in left knee: Secondary | ICD-10-CM | POA: Diagnosis not present

## 2015-06-19 DIAGNOSIS — I872 Venous insufficiency (chronic) (peripheral): Secondary | ICD-10-CM | POA: Diagnosis not present

## 2015-06-22 ENCOUNTER — Other Ambulatory Visit: Payer: Self-pay

## 2015-06-22 DIAGNOSIS — I872 Venous insufficiency (chronic) (peripheral): Secondary | ICD-10-CM

## 2015-07-27 ENCOUNTER — Encounter: Payer: Self-pay | Admitting: Vascular Surgery

## 2015-08-01 ENCOUNTER — Encounter: Payer: Commercial Managed Care - HMO | Admitting: Vascular Surgery

## 2015-08-01 ENCOUNTER — Inpatient Hospital Stay (HOSPITAL_COMMUNITY)
Admission: RE | Admit: 2015-08-01 | Discharge: 2015-08-01 | Disposition: A | Discharge status: Home / Self Care | Payer: Commercial Managed Care - HMO | Source: Ambulatory Visit

## 2015-08-01 DIAGNOSIS — I872 Venous insufficiency (chronic) (peripheral): Secondary | ICD-10-CM

## 2015-08-20 DIAGNOSIS — M545 Low back pain: Secondary | ICD-10-CM | POA: Diagnosis not present

## 2015-08-20 DIAGNOSIS — J3089 Other allergic rhinitis: Secondary | ICD-10-CM | POA: Diagnosis not present

## 2015-08-20 DIAGNOSIS — R1013 Epigastric pain: Secondary | ICD-10-CM | POA: Diagnosis not present

## 2015-08-20 DIAGNOSIS — Z79891 Long term (current) use of opiate analgesic: Secondary | ICD-10-CM | POA: Diagnosis not present

## 2015-11-20 ENCOUNTER — Ambulatory Visit: Payer: Commercial Managed Care - HMO | Admitting: Orthopedic Surgery

## 2015-11-23 ENCOUNTER — Emergency Department (HOSPITAL_COMMUNITY): Payer: Medicare HMO

## 2015-11-23 ENCOUNTER — Encounter (HOSPITAL_COMMUNITY): Payer: Self-pay | Admitting: Emergency Medicine

## 2015-11-23 ENCOUNTER — Emergency Department (HOSPITAL_COMMUNITY)
Admission: EM | Admit: 2015-11-23 | Discharge: 2015-11-23 | Disposition: A | Discharge status: Home / Self Care | Payer: Medicare HMO | Attending: Emergency Medicine | Admitting: Emergency Medicine

## 2015-11-23 DIAGNOSIS — J45909 Unspecified asthma, uncomplicated: Secondary | ICD-10-CM | POA: Diagnosis not present

## 2015-11-23 DIAGNOSIS — E669 Obesity, unspecified: Secondary | ICD-10-CM | POA: Diagnosis not present

## 2015-11-23 DIAGNOSIS — R51 Headache: Secondary | ICD-10-CM | POA: Diagnosis not present

## 2015-11-23 DIAGNOSIS — R05 Cough: Secondary | ICD-10-CM | POA: Insufficient documentation

## 2015-11-23 DIAGNOSIS — I10 Essential (primary) hypertension: Secondary | ICD-10-CM | POA: Insufficient documentation

## 2015-11-23 DIAGNOSIS — R059 Cough, unspecified: Secondary | ICD-10-CM

## 2015-11-23 LAB — I-STAT CHEM 8, ED
BUN: 15 mg/dL (ref 6–20)
CHLORIDE: 99 mmol/L — AB (ref 101–111)
CREATININE: 1.3 mg/dL — AB (ref 0.61–1.24)
Calcium, Ion: 1.12 mmol/L (ref 1.12–1.23)
Glucose, Bld: 96 mg/dL (ref 65–99)
HCT: 45 % (ref 39.0–52.0)
Hemoglobin: 15.3 g/dL (ref 13.0–17.0)
POTASSIUM: 3.4 mmol/L — AB (ref 3.5–5.1)
Sodium: 140 mmol/L (ref 135–145)
TCO2: 30 mmol/L (ref 0–100)

## 2015-11-23 MED ORDER — HYDROCOD POLST-CPM POLST ER 10-8 MG/5ML PO SUER: 5.0000 mL | Freq: Two times a day (BID) | ORAL | Status: DC | PRN

## 2015-11-23 NOTE — Discharge Instructions (Signed)

## 2015-11-23 NOTE — ED Provider Notes (Signed)
CSN: [id number]     Arrival date & time 11/23/15  1322 History   First MD Initiated Contact with Patient 11/23/15 1348     Chief Complaint  Patient presents with  . Cough     Patient is a 55 y.o. male presenting with cough. The history is provided by the patient.  Cough Severity:  Moderate Onset quality:  Gradual Duration:  1 week Timing:  Intermittent Progression:  Worsening Chronicity:  New Smoker: no   Relieved by:  Nothing Worsened by:  Nothing tried Associated symptoms: chills, shortness of breath and wheezing   Patient with h/o hypertension who presents with cough for one week with green productive sputum No hemoptysis He reports mild SOB/wheezing No CP No fever No vomiting/diarrhea He is a nonsmoker He reports generalized fatigue for several and this his "blood sugar" may be up He also reports intermittent dizziness though he has h/o vertigo and feels similar to prior   Past Medical History  Diagnosis Date  . Allergic rhinitis   . Hypertension     benign essentia  . Carpal tunnel syndrome   . Intervertebral disc disorder   . Obesity   . Peptic ulcer     remote past  . Hypercholesterolemia   . Asthma   . Depressive reaction   . Paroxysmal vertigo   . Anxiety   . Arthritis    Past Surgical History  Procedure Laterality Date  . Hand surgery      left  . Colonoscopy  09/11/2011    Procedure: COLONOSCOPY;  Surgeon: Daneil Dolin, MD;  Location: [facility];  Service: Endoscopy;  Laterality: N/A;  9:15   Family History  Problem Relation Age of Onset  . Colon cancer Paternal Uncle   . Pneumonia Father     died at age 60  . Heart disease    . Asthma    . Diabetes    . Hypertension Mother   . Hypertension Other    Social History  Substance Use Topics  . Smoking status: Never Smoker   . Smokeless tobacco: Never Used  . Alcohol Use: Yes     Comment: glass of wine every now and then    Review of Systems  Constitutional: Positive for chills and  fatigue.  Respiratory: Positive for cough, shortness of breath and wheezing.   Neurological: Positive for dizziness.  All other systems reviewed and are negative.     Allergies  Latex  Home Medications   Prior to Admission medications   Medication Sig Start Date End Date Taking? Authorizing Provider  acetaminophen (TYLENOL) 160 MG/5ML liquid Take 320 mg by mouth 2 (two) times daily as needed for fever.    Historical Provider, MD  albuterol (PROVENTIL HFA;VENTOLIN HFA) 108 (90 BASE) MCG/ACT inhaler Inhale 2 puffs into the lungs every 4 (four) hours as needed for wheezing or shortness of breath (cough). 10/28/11 03/31/13  Charlena Cross, MD  albuterol (PROVENTIL) (2.5 MG/3ML) 0.083% nebulizer solution Take 2.5 mg by nebulization every 4 (four) hours as needed for wheezing or shortness of breath.  08/12/11   Historical Provider, MD  albuterol (PROVENTIL) (2.5 MG/3ML) 0.083% nebulizer solution Take 3 mLs (2.5 mg total) by nebulization every 6 (six) hours as needed for wheezing. Patient not taking: Reported on 06/13/2014 03/31/13   Ashley Murrain, NP  beclomethasone (QVAR) 80 MCG/ACT inhaler Inhale 2 puffs into the lungs 2 (two) times daily as needed (shortness of breath).     Historical Provider, MD  betamethasone dipropionate (DIPROLENE) 0.05 % cream Apply 1 application topically 2 (two) times daily as needed (itching).  04/14/14   Historical Provider, MD  cetirizine (ZYRTEC) 10 MG tablet Take 1 tablet by mouth daily. 05/25/14   Historical Provider, MD  diclofenac (VOLTAREN) 75 MG EC tablet Take 1 tablet (75 mg total) by mouth 2 (two) times daily. Take with food 03/28/15   Tammy Triplett, PA-C  docusate sodium (COLACE) 100 MG capsule Take 200 mg by mouth daily as needed for mild constipation.    Historical Provider, MD  famotidine (PEPCID) 20 MG tablet Take 1 tablet (20 mg total) by mouth 2 (two) times daily. 06/13/14   Nat Christen, MD  fluticasone (FLONASE) 50 MCG/ACT nasal spray Place 1 spray into the  nose daily.    Historical Provider, MD  furosemide (LASIX) 40 MG tablet Take 1 tablet by mouth daily as needed for fluid.  06/05/14   Historical Provider, MD  ibuprofen (ADVIL,MOTRIN) 800 MG tablet Take 800 mg by mouth every 8 (eight) hours as needed. Pain/inflammation 07/30/11   Historical Provider, MD  magic mouthwash w/lidocaine SOLN Take 5 mLs by mouth 3 (three) times daily as needed for mouth pain. Swish and spit, do not swallow 05/04/15   Tammy Triplett, PA-C  NEXIUM 40 MG capsule Take 40 mg by mouth daily before breakfast.  08/12/11   Historical Provider, MD  polyethylene glycol powder (GLYCOLAX/MIRALAX) powder Take 17 g by mouth daily. Patient taking differently: Take 17 g by mouth daily as needed for mild constipation.  08/30/12   Orvil Feil, NP  potassium chloride SA (K-DUR,KLOR-CON) 20 MEQ tablet Take 20 mEq by mouth every morning.  08/12/11   Historical Provider, MD  valsartan-hydrochlorothiazide (DIOVAN-HCT) 160-12.5 MG per tablet Take 1 tablet by mouth daily.  08/12/11   Historical Provider, MD   BP 132/78 mmHg  Pulse 85  Temp(Src) 98.2 F (36.8 C) (Oral)  Resp 18  Ht 5\' 11"  (1.803 m)  Wt 121.564 kg  BMI 37.39 kg/m2  SpO2 99% Physical Exam CONSTITUTIONAL: Well developed/well nourished HEAD: Normocephalic/atraumatic EYES: EOMI/PERRL ENMT: Mucous membranes moist NECK: supple no meningeal signs SPINE/BACK:entire spine nontender CV: S1/S2 noted, no murmurs/rubs/gallops noted LUNGS: Lungs are clear to auscultation bilaterally, no apparent distress ABDOMEN: soft, nontender, no rebound or guarding, bowel sounds noted throughout abdomen NEURO: Pt is awake/alert/appropriate, moves all extremitiesx4.  No facial droop.  No ataxia EXTREMITIES: pulses normal/equal, full ROM SKIN: warm, color normal PSYCH: no abnormalities of mood noted, alert and oriented to situation  ED Course  Procedures  2:53 PM Pt well appearing, no distress His main issue was cough for one week and since it  was productive he thought he needed antibiotic 3:10 PM CXR negative No hypoxia He is afebrile I don't feel antibiotics warranted  Labs Review Labs Reviewed  I-STAT CHEM 8, ED - Abnormal; Notable for the following:    Potassium 3.4 (*)    Chloride 99 (*)    Creatinine, Ser 1.30 (*)    All other components within normal limits    Imaging Review Dg Chest 2 View  11/23/2015  CLINICAL DATA:  One week of cough, sore throat, headache, and generalized weakness ; dizziness for 2 weeks; history of asthma EXAM: CHEST  2 VIEW COMPARISON:  PA and lateral chest x-ray of March 31, 2013 FINDINGS: The lungs are adequately inflated. There is no focal infiltrate. The interstitial markings are coarse bilaterally. There is no pleural effusion or pneumothorax. The heart is normal in  size. The trachea is midline. The pulmonary vascularity is not engorged. The bony thorax exhibits no acute abnormality. IMPRESSION: There is no active cardiopulmonary disease. Electronically Signed   By: David  Martinique M.D.   On: 11/23/2015 14:30   I have personally reviewed and evaluated these images and lab results as part of my medical decision-making.   EKG Interpretation   Date/Time:  Friday Nov 23 2015 14:35:38 EDT Ventricular Rate:  76 PR Interval:  178 QRS Duration: 80 QT Interval:  366 QTC Calculation: 411 R Axis:   40 Text Interpretation:  Sinus rhythm Low voltage, precordial leads No  previous ECGs available Confirmed by Christy Gentles  MD, Elenore Rota ([id number]) on  11/23/2015 2:45:36 PM      MDM   Final diagnoses:  Cough    Nursing notes including past medical history and social history reviewed and considered in documentation xrays/imaging reviewed by myself and considered during evaluation Labs/vital reviewed myself and considered during evaluation     Ripley Fraise, MD 11/23/15 1511

## 2015-11-23 NOTE — ED Provider Notes (Signed)
Pt requesting an antibiotic He has no fever, no cough while I am in room, no hypoxia, lung sounds clear, CXR negative He is not a smoker Advised antibiotic can cause more harm at this point He has albuterol at home Will add on cough syrup for comfort He thinks something is wrong causing his nasal congestion, cough, mildly hoarse voice and ear congestion (bilateral TMs clear/intact) Advised likely non-bacterial, possibly viral   Ripley Fraise, MD 11/23/15 1546

## 2015-11-23 NOTE — ED Notes (Signed)
Pt reports cough,headache,sore throat,generalized weakness x1 week. Pt reports dizziness x2 weeks. nad noted. Pt reports intermittent productive cough.

## 2015-12-10 ENCOUNTER — Ambulatory Visit: Payer: Commercial Managed Care - HMO | Admitting: Orthopedic Surgery

## 2016-01-10 ENCOUNTER — Ambulatory Visit: Payer: Medicare HMO | Admitting: Orthopedic Surgery

## 2016-02-11 ENCOUNTER — Encounter: Payer: Medicare HMO | Admitting: Orthopedic Surgery

## 2016-03-18 ENCOUNTER — Emergency Department (HOSPITAL_COMMUNITY): Payer: No Typology Code available for payment source

## 2016-03-18 ENCOUNTER — Emergency Department (HOSPITAL_COMMUNITY)
Admission: EM | Admit: 2016-03-18 | Discharge: 2016-03-18 | Disposition: A | Discharge status: Home / Self Care | Payer: No Typology Code available for payment source | Attending: Emergency Medicine | Admitting: Emergency Medicine

## 2016-03-18 ENCOUNTER — Encounter (HOSPITAL_COMMUNITY): Payer: Self-pay | Admitting: Emergency Medicine

## 2016-03-18 DIAGNOSIS — Z792 Long term (current) use of antibiotics: Secondary | ICD-10-CM | POA: Diagnosis not present

## 2016-03-18 DIAGNOSIS — Y92410 Unspecified street and highway as the place of occurrence of the external cause: Secondary | ICD-10-CM | POA: Insufficient documentation

## 2016-03-18 DIAGNOSIS — I10 Essential (primary) hypertension: Secondary | ICD-10-CM | POA: Diagnosis not present

## 2016-03-18 DIAGNOSIS — S161XXA Strain of muscle, fascia and tendon at neck level, initial encounter: Secondary | ICD-10-CM

## 2016-03-18 DIAGNOSIS — Z791 Long term (current) use of non-steroidal anti-inflammatories (NSAID): Secondary | ICD-10-CM | POA: Diagnosis not present

## 2016-03-18 DIAGNOSIS — Y999 Unspecified external cause status: Secondary | ICD-10-CM | POA: Diagnosis not present

## 2016-03-18 DIAGNOSIS — S134XXA Sprain of ligaments of cervical spine, initial encounter: Secondary | ICD-10-CM | POA: Diagnosis not present

## 2016-03-18 DIAGNOSIS — Y939 Activity, unspecified: Secondary | ICD-10-CM | POA: Diagnosis not present

## 2016-03-18 DIAGNOSIS — S199XXA Unspecified injury of neck, initial encounter: Secondary | ICD-10-CM | POA: Diagnosis present

## 2016-03-18 DIAGNOSIS — Z79899 Other long term (current) drug therapy: Secondary | ICD-10-CM | POA: Diagnosis not present

## 2016-03-18 DIAGNOSIS — J45909 Unspecified asthma, uncomplicated: Secondary | ICD-10-CM | POA: Insufficient documentation

## 2016-03-18 MED ORDER — METHOCARBAMOL 500 MG PO TABS
500.0000 mg | ORAL_TABLET | Freq: Once | ORAL | Status: AC
  Administered 2016-03-18: 500 mg via ORAL
  Filled 2016-03-18: qty 1

## 2016-03-18 MED ORDER — HYDROCODONE-ACETAMINOPHEN 5-325 MG PO TABS
2.0000 | ORAL_TABLET | Freq: Once | ORAL | Status: AC
  Administered 2016-03-18: 2 via ORAL
  Filled 2016-03-18: qty 2

## 2016-03-18 MED ORDER — METHOCARBAMOL 500 MG PO TABS: 500.0000 mg | ORAL_TABLET | Freq: Three times a day (TID) | ORAL | 0 refills | Status: DC | PRN

## 2016-03-18 MED ORDER — HYDROCODONE-ACETAMINOPHEN 5-325 MG PO TABS: 1.0000 | ORAL_TABLET | Freq: Four times a day (QID) | ORAL | 0 refills | Status: AC | PRN

## 2016-03-18 MED ORDER — IBUPROFEN 800 MG PO TABS: 800.0000 mg | ORAL_TABLET | Freq: Three times a day (TID) | ORAL | 0 refills | Status: AC | PRN
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
## 2016-04-02 ENCOUNTER — Emergency Department (HOSPITAL_COMMUNITY): Payer: Medicare HMO

## 2016-04-02 ENCOUNTER — Emergency Department (HOSPITAL_COMMUNITY)
Admission: EM | Admit: 2016-04-02 | Discharge: 2016-04-02 | Disposition: A | Discharge status: Home / Self Care | Payer: Medicare HMO | Attending: Emergency Medicine | Admitting: Emergency Medicine

## 2016-04-02 ENCOUNTER — Encounter (HOSPITAL_COMMUNITY): Payer: Self-pay | Admitting: Emergency Medicine

## 2016-04-02 DIAGNOSIS — Z79891 Long term (current) use of opiate analgesic: Secondary | ICD-10-CM | POA: Insufficient documentation

## 2016-04-02 DIAGNOSIS — Z79899 Other long term (current) drug therapy: Secondary | ICD-10-CM | POA: Diagnosis not present

## 2016-04-02 DIAGNOSIS — R05 Cough: Secondary | ICD-10-CM | POA: Diagnosis present

## 2016-04-02 DIAGNOSIS — J4541 Moderate persistent asthma with (acute) exacerbation: Secondary | ICD-10-CM | POA: Diagnosis not present

## 2016-04-02 DIAGNOSIS — Z791 Long term (current) use of non-steroidal anti-inflammatories (NSAID): Secondary | ICD-10-CM | POA: Diagnosis not present

## 2016-04-02 DIAGNOSIS — I10 Essential (primary) hypertension: Secondary | ICD-10-CM | POA: Diagnosis not present

## 2016-04-02 DIAGNOSIS — J209 Acute bronchitis, unspecified: Secondary | ICD-10-CM | POA: Diagnosis not present

## 2016-04-02 MED ORDER — ALBUTEROL SULFATE (2.5 MG/3ML) 0.083% IN NEBU: 5.0000 mg | INHALATION_SOLUTION | Freq: Once | RESPIRATORY_TRACT | Status: DC

## 2016-04-02 MED ORDER — PREDNISONE 10 MG PO TABS: ORAL_TABLET | ORAL | 0 refills | Status: DC

## 2016-04-02 MED ORDER — ALBUTEROL SULFATE HFA 108 (90 BASE) MCG/ACT IN AERS
2.0000 | INHALATION_SPRAY | RESPIRATORY_TRACT | Status: DC
  Administered 2016-04-02: 2 via RESPIRATORY_TRACT
  Filled 2016-04-02: qty 6.7

## 2016-04-02 MED ORDER — AZITHROMYCIN 250 MG PO TABS: 250.0000 mg | ORAL_TABLET | Freq: Every day | ORAL | 0 refills | Status: DC

## 2016-04-02 MED ORDER — IPRATROPIUM-ALBUTEROL 0.5-2.5 (3) MG/3ML IN SOLN
3.0000 mL | Freq: Once | RESPIRATORY_TRACT | Status: AC
  Administered 2016-04-02: 3 mL via RESPIRATORY_TRACT
  Filled 2016-04-02: qty 3

## 2016-04-02 MED ORDER — PREDNISONE 50 MG PO TABS
60.0000 mg | ORAL_TABLET | Freq: Every day | ORAL | Status: DC
  Administered 2016-04-02: 60 mg via ORAL
  Filled 2016-04-02 (×2): qty 1

## 2016-04-02 MED ORDER — ALBUTEROL (5 MG/ML) CONTINUOUS INHALATION SOLN
10.0000 mg/h | INHALATION_SOLUTION | Freq: Once | RESPIRATORY_TRACT | Status: AC
  Administered 2016-04-02: 10 mg/h via RESPIRATORY_TRACT
  Filled 2016-04-02: qty 20

## 2016-04-02 NOTE — ED Notes (Signed)
Pt receiving continuos neb at this time

## 2016-04-02 NOTE — ED Notes (Signed)
Continuos neb completed

## 2016-04-02 NOTE — ED Provider Notes (Signed)
[facility] DEPT Provider Note   CSN: [id number] Arrival date & time: 04/02/16  1008     History   Chief Complaint Chief Complaint  Patient presents with  . Cough    HPI Parker Rodriguez is a 55 y.o. male.  The history is provided by the patient. No language interpreter was used.  Cough  This is a new problem. The current episode started 2 days ago. The problem occurs constantly. The problem has been gradually worsening. The cough is non-productive. There has been no fever. Associated symptoms include wheezing. He has tried nothing for the symptoms. The treatment provided no relief. He is not a smoker. His past medical history is significant for asthma.  Pt complains of being short of breath.  Pt has a history of asthma and a history of bronchitis.  Pt reports he has coughed up some yellow phelgm  Past Medical History:  Diagnosis Date  . Allergic rhinitis   . Anxiety   . Arthritis   . Asthma   . Carpal tunnel syndrome   . Depressive reaction   . Hypercholesterolemia   . Hypertension    benign essentia  . Intervertebral disc disorder   . Obesity   . Paroxysmal vertigo   . Peptic ulcer    remote past    Patient Active Problem List   Diagnosis Date Noted  . Rectal bleeding 08/24/2011  . KNEE, ARTHRITIS, DEGEN./OSTEO 02/20/2010  . VIRAL URI 04/02/2009  . DYSPNEA ON EXERTION 11/29/2008  . DEGENERATIVE DISC DISEASE, LUMBOSACRAL SPINE W/RADICULOPATHY 06/25/2007  . FASTING HYPERGLYCEMIA 06/25/2007  . Carpal tunnel syndrome 04/27/2007  . DEPRESSION/ANXIETY 01/11/2007  . HYPERLIPIDEMIA 07/04/2006  . MORBID OBESITY 07/04/2006  . HYPERTENSION 07/04/2006  . HEMORRHOIDS 07/04/2006  . ALLERGIC RHINITIS 07/04/2006  . GERD 07/04/2006  . 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 OSTEOARTHRITIS 07/04/2006    Past Surgical History:  Procedure Laterality Date  . COLONOSCOPY  09/11/2011   Procedure: COLONOSCOPY;  Surgeon: Daneil Dolin, MD;  Location: [facility];   Service: Endoscopy;  Laterality: N/A;  9:15  . HAND SURGERY     left       Home Medications    Prior to Admission medications   Medication Sig Start Date End Date Taking? Authorizing Provider  beclomethasone (QVAR) 80 MCG/ACT inhaler Inhale 2 puffs into the lungs 2 (two) times daily as needed (shortness of breath).    Yes Historical Provider, MD  betamethasone dipropionate (DIPROLENE) 0.05 % cream Apply 1 application topically 2 (two) times daily as needed (itching).  04/14/14  Yes Historical Provider, MD  cetirizine (ZYRTEC) 10 MG tablet Take 1 tablet by mouth daily. 05/25/14  Yes Historical Provider, MD  diclofenac (VOLTAREN) 75 MG EC tablet Take 1 tablet (75 mg total) by mouth 2 (two) times daily. Take with food 03/28/15  Yes Tammy Triplett, PA-C  docusate sodium (COLACE) 100 MG capsule Take 200 mg by mouth daily as needed for mild constipation.   Yes Historical Provider, MD  esomeprazole (NEXIUM) 40 MG capsule Take 1 capsule by mouth daily. 03/26/16  Yes Historical Provider, MD  fluticasone (FLONASE) 50 MCG/ACT nasal spray Place 1 spray into the nose daily.   Yes Historical Provider, MD  furosemide (LASIX) 40 MG tablet Take 1 tablet by mouth daily as needed for fluid.  06/05/14  Yes Historical Provider, MD  HYDROcodone-acetaminophen (NORCO/VICODIN) 5-325 MG tablet Take 1-2 tablets by mouth every 6 (six) hours as needed. Patient taking differently: Take 1-2 tablets by mouth every  6 (six) hours as needed for moderate pain.  03/18/16  Yes Kristen N Ward, DO  ibuprofen (ADVIL,MOTRIN) 800 MG tablet Take 1 tablet (800 mg total) by mouth every 8 (eight) hours as needed for mild pain. 03/18/16  Yes Kristen N Ward, DO  latanoprost (XALATAN) 0.005 % ophthalmic solution Place 1 drop into both eyes at bedtime. 02/19/16  Yes Historical Provider, MD  methocarbamol (ROBAXIN) 500 MG tablet Take 1 tablet (500 mg total) by mouth every 8 (eight) hours as needed for muscle spasms. 03/18/16  Yes Kristen N Ward, DO    polyethylene glycol powder (GLYCOLAX/MIRALAX) powder Take 17 g by mouth daily. Patient taking differently: Take 17 g by mouth daily as needed for mild constipation.  08/30/12  Yes Annitta Needs, NP  potassium chloride SA (K-DUR,KLOR-CON) 20 MEQ tablet Take 20 mEq by mouth every morning.  08/12/11  Yes Historical Provider, MD  valsartan-hydrochlorothiazide (DIOVAN-HCT) 80-12.5 MG tablet Take 1 tablet by mouth daily. 02/09/16  Yes Historical Provider, MD  chlorpheniramine-HYDROcodone (TUSSIONEX PENNKINETIC ER) 10-8 MG/5ML SUER Take 5 mLs by mouth every 12 (twelve) hours as needed for cough. Patient not taking: Reported on 04/02/2016 11/23/15   Ripley Fraise, MD    Family History Family History  Problem Relation Age of Onset  . Colon cancer Paternal Uncle   . Pneumonia Father     died at age 17  . Heart disease    . Asthma    . Diabetes    . Hypertension Mother   . Hypertension Other     Social History Social History  Substance Use Topics  . Smoking status: Never Smoker  . Smokeless tobacco: Never Used  . Alcohol use Yes     Comment: glass of wine every now and then     Allergies   Latex   Review of Systems Review of Systems  Respiratory: Positive for cough and wheezing.   All other systems reviewed and are negative.    Physical Exam Updated Vital Signs BP 143/75 (BP Location: Left Arm)   Pulse 91   Temp 98.8 F (37.1 C) (Oral)   Resp 18   Ht 6' (1.829 m)   Wt 123.4 kg   SpO2 100%   BMI 36.89 kg/m   Physical Exam  Pulmonary/Chest: He has wheezes.     ED Treatments / Results  Labs (all labs ordered are listed, but only abnormal results are displayed) Labs Reviewed - No data to display  EKG  EKG Interpretation None       Radiology Dg Chest 2 View  Result Date: 04/02/2016 CLINICAL DATA:  Productive cough, fever and chills for several days. Bilateral rib pain with coughing. EXAM: CHEST  2 VIEW COMPARISON:  PA and lateral chest 11/23/2015 and  03/31/2013. FINDINGS: The lungs are clear. Heart size is upper normal. No pneumothorax or pleural effusion. No focal bony abnormality. IMPRESSION: No acute disease. Electronically Signed   By: Inge Rise M.D.   On: 04/02/2016 11:18    Procedures Procedures (including critical care time)  Medications Ordered in ED Medications  predniSONE (DELTASONE) tablet 60 mg (60 mg Oral Given 04/02/16 1153)  ipratropium-albuterol (DUONEB) 0.5-2.5 (3) MG/3ML nebulizer solution 3 mL (3 mLs Nebulization Given 04/02/16 1117)  albuterol (PROVENTIL,VENTOLIN) solution continuous neb (10 mg/hr Nebulization Given 04/02/16 1315)     Initial Impression / Assessment and Plan / ED Course  I have reviewed the triage vital signs and the nursing notes.  Pertinent labs & imaging results that were  available during my care of the patient were reviewed by me and considered in my medical decision making (see chart for details).  Clinical Course    Meds ordered this encounter  Medications  . DISCONTD: albuterol (PROVENTIL) (2.5 MG/3ML) 0.083% nebulizer solution 5 mg  . ipratropium-albuterol (DUONEB) 0.5-2.5 (3) MG/3ML nebulizer solution 3 mL  . predniSONE (DELTASONE) tablet 60 mg  . albuterol (PROVENTIL,VENTOLIN) solution continuous neb  . esomeprazole (NEXIUM) 40 MG capsule    Sig: Take 1 capsule by mouth daily.    Refill:  11  . latanoprost (XALATAN) 0.005 % ophthalmic solution    Sig: Place 1 drop into both eyes at bedtime.    Refill:  3  . valsartan-hydrochlorothiazide (DIOVAN-HCT) 80-12.5 MG tablet    Sig: Take 1 tablet by mouth daily.    Refill:  3  . azithromycin (ZITHROMAX) 250 MG tablet    Sig: Take 1 tablet (250 mg total) by mouth daily. Take first 2 tablets together, then 1 every day until finished.    Dispense:  6 tablet    Refill:  0    Order Specific Question:   Supervising Provider    Answer:   MILLER, BRIAN [[id number]]  . predniSONE (DELTASONE) 10 MG tablet    Sig: 6,5,4,3,2.1    Dispense:  21  tablet    Refill:  0    Order Specific Question:   Supervising Provider    Answer:   MILLER, BRIAN [[id number]]  . albuterol (PROVENTIL HFA;VENTOLIN HFA) 108 (90 Base) MCG/ACT inhaler 2 puff    Final Clinical Impressions(s) / ED Diagnoses  No relief after duo neb.  Pt placed on 1 hour continous neb.   Pt given prednisone 60mg .   Pt reports he feels much better.  Pt reports he wants to be on an antibiotic.  Final diagnoses:  Acute bronchitis, unspecified organism  Asthma, moderate persistent, with acute exacerbation    New Prescriptions Discharge Medication List as of 04/02/2016  3:07 PM    START taking these medications   Details  azithromycin (ZITHROMAX) 250 MG tablet Take 1 tablet (250 mg total) by mouth daily. Take first 2 tablets together, then 1 every day until finished., Starting Wed 04/02/2016, Print    predniSONE (DELTASONE) 10 MG tablet 6,5,4,3,2.1, Print      An After Visit Summary was printed and given to the patient.   Hollace Kinnier [place], PA-C 04/02/16 [place], MD 04/02/16 1620

## 2016-04-02 NOTE — ED Triage Notes (Addendum)
Pt reports productive cough,intermittent fever,chills for last several days. Pt reports bilateral rib pain with coughing. Auditory and expiratory wheezes noted during triage. Pt reports used inhaler at home with minimal relief. Pt reports cough is worse at night.

## 2016-10-28 ENCOUNTER — Ambulatory Visit (INDEPENDENT_AMBULATORY_CARE_PROVIDER_SITE_OTHER): Payer: Medicare HMO | Admitting: Orthopedic Surgery

## 2016-10-28 ENCOUNTER — Ambulatory Visit (INDEPENDENT_AMBULATORY_CARE_PROVIDER_SITE_OTHER): Payer: Medicare HMO

## 2016-10-28 VITALS — BP 117/75 | HR 79 | Ht 70.0 in | Wt 282.0 lb

## 2016-10-28 DIAGNOSIS — M1710 Unilateral primary osteoarthritis, unspecified knee: Secondary | ICD-10-CM

## 2016-10-28 NOTE — Progress Notes (Signed)
Patient ID: Parker Rodriguez, male   DOB: 1960-10-19, 56 y.o.   MRN: [id number]  Chief Complaint  Patient presents with  . Knee Pain    left knee pain    HPI Parker Rodriguez is a 56 y.o. male.  Presents for reevaluation of his left knee. He's had evaluations before he has put off knee surgery.  He comes in with medial sided and diffuse left knee pain with a dull throbbing aching type of pain which is moderate to severe at times mainly mild to moderate. He's had it for over 7 years it's worse with exercises he feels like his knee gives way at times.  He has lumbar disc condition he was recommended to have surgery but declined many years ago and has been able to survive. He says he has no radicular pain just lower back pain across the small of his back  Review of Systems Review of Systems (2 MINIMUM) No current chest pain or shortness of breath at this time   Past Medical History:  Diagnosis Date  . Allergic rhinitis   . Anxiety   . Arthritis   . Asthma   . Carpal tunnel syndrome   . Depressive reaction   . Hypercholesterolemia   . Hypertension    benign essentia  . Intervertebral disc disorder   . Obesity   . Paroxysmal vertigo   . Peptic ulcer    remote past    Past Surgical History:  Procedure Laterality Date  . COLONOSCOPY  09/11/2011   Procedure: COLONOSCOPY;  Surgeon: Parker Dolin, MD;  Location: [facility];  Service: Endoscopy;  Laterality: N/A;  9:15  . HAND SURGERY     left    Social History Social History  Substance Use Topics  . Smoking status: Never Smoker  . Smokeless tobacco: Never Used  . Alcohol use Yes     Comment: glass of wine every now and then    Allergies  Allergen Reactions  . Latex Other (See Comments)    Smell causes congestion     No outpatient prescriptions have been marked as taking for the 10/28/16 encounter (Office Visit) with Carole Civil, MD.      Physical Exam Physical Exam 1.BP 117/75   Pulse 79   Ht 5\' 10"   (1.778 m)   Wt 282 lb (127.9 kg)   BMI 40.46 kg/m   2. Gen. appearance. The patient is well-developed and well-nourished, grooming and hygiene are normal. There are no gross congenital abnormalities  3. The patient is alert and oriented to person place and time  4. Mood and affect are normal  He has a waddling gait no specific limp  Examination reveals the following:   We find the following in terms of his knee inspection reveals varus alignment palpation medial tenderness range of motion is 110 knee is stable strength normal skin is intact pulses are good lymph nodes are negative sensation is normal pronation and balance remain normal  His right knee is asymptomatic with a functional range of motion of 120 knee is stable strength normal skin is intact  MEDICAL DECISION MAKING:    Data Reviewed X-ray show symmetric joint space narrowing medial and lateral compartments with some mild subchondral bone changes as well as peripheral osteophytes  His knee arthritis is definitely present I would call it moderate disease on x-ray. I think I have exhausted questions regarding his back and contributing in any way to his knee it seems like it's  just his knee  He's going on vacation and he will call us back if he wants to schedule knee replacement surgery  Encounter Diagnosis  Name Primary?  Marland Kitchen Arthritis of knee Yes    Arther Abbott 10/28/2016, 12:07 PM

## 2016-11-13 ENCOUNTER — Telehealth: Payer: Self-pay | Admitting: Orthopedic Surgery

## 2016-11-13 NOTE — Telephone Encounter (Signed)
Patient wants to know if we have his brace yet

## 2016-11-17 NOTE — Telephone Encounter (Signed)
PLEASE ADVISE HIM HE CAN COME IN TODAY OR TOMORROW FOR BRACE FITTING

## 2016-12-02 NOTE — Telephone Encounter (Signed)
Patient called back to follow up on status of his special order brace.  Came to attention that nurse had entered phone message on 11/17/16 to advise patient that his brace is in.  I called patient, notified, and offered for him to come today, 12/02/16, for brace fitting.  He will do so.

## 2017-01-26 ENCOUNTER — Emergency Department (HOSPITAL_COMMUNITY)
Admission: EM | Admit: 2017-01-26 | Discharge: 2017-01-26 | Disposition: A | Discharge status: Home Health | Payer: Medicare HMO | Attending: Emergency Medicine | Admitting: Emergency Medicine

## 2017-01-26 ENCOUNTER — Encounter (HOSPITAL_COMMUNITY): Payer: Self-pay | Admitting: Emergency Medicine

## 2017-01-26 ENCOUNTER — Emergency Department (HOSPITAL_COMMUNITY): Payer: Medicare HMO

## 2017-01-26 DIAGNOSIS — I10 Essential (primary) hypertension: Secondary | ICD-10-CM | POA: Diagnosis not present

## 2017-01-26 DIAGNOSIS — Z79891 Long term (current) use of opiate analgesic: Secondary | ICD-10-CM | POA: Diagnosis not present

## 2017-01-26 DIAGNOSIS — Z7951 Long term (current) use of inhaled steroids: Secondary | ICD-10-CM | POA: Diagnosis not present

## 2017-01-26 DIAGNOSIS — Z791 Long term (current) use of non-steroidal anti-inflammatories (NSAID): Secondary | ICD-10-CM | POA: Insufficient documentation

## 2017-01-26 DIAGNOSIS — K4090 Unilateral inguinal hernia, without obstruction or gangrene, not specified as recurrent: Secondary | ICD-10-CM

## 2017-01-26 DIAGNOSIS — Z91040 Latex allergy status: Secondary | ICD-10-CM | POA: Diagnosis not present

## 2017-01-26 DIAGNOSIS — R1031 Right lower quadrant pain: Secondary | ICD-10-CM | POA: Diagnosis not present

## 2017-01-26 DIAGNOSIS — J45909 Unspecified asthma, uncomplicated: Secondary | ICD-10-CM | POA: Insufficient documentation

## 2017-01-26 LAB — URINALYSIS, ROUTINE W REFLEX MICROSCOPIC
BILIRUBIN URINE: NEGATIVE
Bacteria, UA: NONE SEEN
Glucose, UA: NEGATIVE mg/dL
Hgb urine dipstick: NEGATIVE
Ketones, ur: NEGATIVE mg/dL
LEUKOCYTES UA: NEGATIVE
NITRITE: NEGATIVE
PH: 6 (ref 5.0–8.0)
Protein, ur: NEGATIVE mg/dL
SPECIFIC GRAVITY, URINE: 1.01 (ref 1.005–1.030)
SQUAMOUS EPITHELIAL / LPF: NONE SEEN

## 2017-01-26 MED ORDER — PREDNISONE 20 MG PO TABS: 40.0000 mg | ORAL_TABLET | Freq: Every day | ORAL | 0 refills | Status: DC

## 2017-01-26 MED ORDER — KETOROLAC TROMETHAMINE 30 MG/ML IJ SOLN
15.0000 mg | Freq: Once | INTRAMUSCULAR | Status: AC
  Administered 2017-01-26: 15 mg via INTRAMUSCULAR
  Filled 2017-01-26: qty 1

## 2017-01-26 NOTE — ED Provider Notes (Signed)
[facility] DEPT Provider Note   CSN: [id number] Arrival date & time: 01/26/17  1312     History   Chief Complaint Chief Complaint  Patient presents with  . Groin Pain    HPI Parker Rodriguez is a 55 y.o. male.  HPI Patient presents with concern of right groin pain. Onset was one week ago without clear precipitant. Since onset patient has had soreness, swelling, sharp sensation in the right groin, intermittently, worse with motion. No scrotal pain, no dysuria, no hematuria. No abdominal pain nausea, vomiting. Patient denies history of known hernia, also denies a history of kidney stone. Patient does have ongoing issues with his left knee, is preparing for a replacement. He acknowledges favoring his left leg due to this ongoing discomfort. Since onset no clear relief with anything, and as well, symptoms are worse with activity. Past Medical History:  Diagnosis Date  . Allergic rhinitis   . Anxiety   . Arthritis   . Asthma   . Carpal tunnel syndrome   . Depressive reaction   . Hypercholesterolemia   . Hypertension    benign essentia  . Intervertebral disc disorder   . Obesity   . Paroxysmal vertigo   . Peptic ulcer    remote past    Patient Active Problem List   Diagnosis Date Noted  . Rectal bleeding 08/24/2011  . KNEE, ARTHRITIS, DEGEN./OSTEO 02/20/2010  . VIRAL URI 04/02/2009  . DYSPNEA ON EXERTION 11/29/2008  . DEGENERATIVE DISC DISEASE, LUMBOSACRAL SPINE W/RADICULOPATHY 06/25/2007  . FASTING HYPERGLYCEMIA 06/25/2007  . Carpal tunnel syndrome 04/27/2007  . DEPRESSION/ANXIETY 01/11/2007  . HYPERLIPIDEMIA 07/04/2006  . MORBID OBESITY 07/04/2006  . HYPERTENSION 07/04/2006  . HEMORRHOIDS 07/04/2006  . ALLERGIC RHINITIS 07/04/2006  . GERD 07/04/2006  . 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 OSTEOARTHRITIS 07/04/2006    Past Surgical History:  Procedure Laterality Date  . COLONOSCOPY  09/11/2011   Procedure: COLONOSCOPY;  Surgeon: Daneil Dolin, MD;   Location: [facility];  Service: Endoscopy;  Laterality: N/A;  9:15  . HAND SURGERY     left       Home Medications    Prior to Admission medications   Medication Sig Start Date End Date Taking? Authorizing Provider  beclomethasone (QVAR) 80 MCG/ACT inhaler Inhale 2 puffs into the lungs 2 (two) times daily as needed (shortness of breath).    Yes [provider]  betamethasone dipropionate (DIPROLENE) 0.05 % cream Apply 1 application topically 2 (two) times daily as needed (itching).  04/14/14  Yes [provider]  cetirizine (ZYRTEC) 10 MG tablet Take 1 tablet by mouth daily. 05/25/14  Yes [provider]  fluticasone (FLONASE) 50 MCG/ACT nasal spray Place 1 spray into the nose daily.   Yes [provider]  furosemide (LASIX) 40 MG tablet Take 1 tablet by mouth daily as needed for fluid.  06/05/14  Yes [provider]  HYDROcodone-acetaminophen (NORCO/VICODIN) 5-325 MG tablet Take 1-2 tablets by mouth every 6 (six) hours as needed. Patient taking differently: Take 1-2 tablets by mouth every 6 (six) hours as needed for moderate pain.  03/18/16  Yes Ward, Delice Bison, DO  ibuprofen (ADVIL,MOTRIN) 800 MG tablet Take 1 tablet (800 mg total) by mouth every 8 (eight) hours as needed for mild pain. 03/18/16  Yes Ward, Cyril Mourning N, DO  latanoprost (XALATAN) 0.005 % ophthalmic solution Place 1 drop into both eyes at bedtime. 02/19/16  Yes [provider]  omeprazole (PRILOSEC) 40 MG capsule Take 1 capsule  Daily FOR ACID REFLUX 01/04/17  Yes [provider]  polyethylene glycol powder (GLYCOLAX/MIRALAX) powder Take 17 g by mouth daily. Patient taking differently: Take 17 g by mouth daily as needed for mild constipation.  08/30/12  Yes Annitta Needs, NP  potassium chloride SA (K-DUR,KLOR-CON) 20 MEQ tablet Take 20 mEq by mouth every morning.  08/12/11  Yes [provider]  valsartan-hydrochlorothiazide (DIOVAN-HCT) 80-12.5 MG tablet Take 1  tablet by mouth daily. 02/09/16  Yes [provider]    Family History Family History  Problem Relation Age of Onset  . Colon cancer Paternal Uncle   . Pneumonia Father        died at age 66  . Heart disease Unknown   . Asthma Unknown   . Diabetes Unknown   . Hypertension Mother   . Hypertension Other     Social History Social History  Substance Use Topics  . Smoking status: Never Smoker  . Smokeless tobacco: Never Used  . Alcohol use Yes     Comment: glass of wine every now and then     Allergies   Latex   Review of Systems Review of Systems  Constitutional:       Per HPI, otherwise negative  HENT:       Per HPI, otherwise negative  Respiratory:       Per HPI, otherwise negative  Cardiovascular:       Per HPI, otherwise negative  Gastrointestinal: Negative for vomiting.  Endocrine:       Negative aside from HPI  Genitourinary:       Neg aside from HPI   Musculoskeletal:       Per HPI, otherwise negative  Skin: Negative.   Neurological: Negative for syncope.     Physical Exam Updated Vital Signs BP 119/63 (BP Location: Right Arm)   Pulse 70   Temp 97.8 F (36.6 C) (Oral)   Resp 18   Ht 5\' 11"  (1.803 m)   Wt 122.5 kg (270 lb)   SpO2 98%   BMI 37.66 kg/m   Physical Exam  Constitutional: He is oriented to person, place, and time. He appears well-developed. No distress.  HENT:  Head: Normocephalic and atraumatic.  Eyes: Conjunctivae and EOM are normal.  Cardiovascular: Normal rate and regular rhythm.   Pulmonary/Chest: Effort normal. No stridor. No respiratory distress.  Abdominal: He exhibits no distension.  Genitourinary: Right testis shows swelling and tenderness. Left testis shows no swelling and no tenderness.     Musculoskeletal: He exhibits no edema.  Lymphadenopathy: No inguinal adenopathy noted on the right or left side.  Neurological: He is alert and oriented to person, place, and time.  Skin: Skin is warm and dry.    Psychiatric: He has a normal mood and affect.  Nursing note and vitals reviewed.    ED Treatments / Results  Labs (all labs ordered are listed, but only abnormal results are displayed) Labs Reviewed  URINALYSIS, [place] MICROSCOPIC     Radiology US Pelvis Limited  Result Date: 01/26/2017 CLINICAL DATA:  Right groin pain for 1 week, question inguinal hernia. EXAM: LIMITED ULTRASOUND OF PELVIS TECHNIQUE: Limited transabdominal ultrasound examination of the pelvis was performed. COMPARISON:  CT 06/14/2014 FINDINGS: No sonographic findings of right groin hernia. Particularly, no bowel peristalsing in the inguinal canal. Lymph nodes in the right groin in the area of pain are prominent in size, largest measuring 2.0 x 0.8 cm, however retain normal lymph node morphology with fatty  hila and are unchanged from prior CT. Similarly prominent lymph nodes in the left groin for comparison. IMPRESSION: 1. No right inguinal hernia seen sonographically. 2. Prominent inguinal lymph nodes, similar to prior CT. These retain normal lymph node morphology and are considered benign given stability over the course of 2.5 years. Electronically Signed   By: Jeb Levering M.D.   On: 01/26/2017 16:34    Procedures Procedures (including critical care time)  Medications Ordered in ED Medications  ketorolac (TORADOL) 30 MG/ML injection 15 mg (15 mg Intramuscular Given 01/26/17 1408)     Initial Impression / Assessment and Plan / ED Course  I have reviewed the triage vital signs and the nursing notes.  Pertinent labs & imaging results that were available during my care of the patient were reviewed by me and considered in my medical decision making (see chart for details). 4:48 PM Patient awake and alert, in no distress, watching television. Discussed all findings with him at length, with reassuring ultrasound, urinalysis, there is no evidence for TIA, without scrotal pain or swelling, no evidence for  orchitis, stone.  We discussed possibilities of soft tissue injury versus adenopathy. Former seems likely as the patient has acknowledged ongoing lower extremity chronic pain issues, acknowledges favoring his left leg. Patient will be started on a course of anti-inflammatories, and follow-up with urology if he is not improved in the coming days.    Final Clinical Impressions(s) / ED Diagnoses  Right groin pain   Carmin Muskrat, MD 01/26/17 1649

## 2017-01-26 NOTE — ED Triage Notes (Signed)
Pt reports right groin pain for about a week with no urinary s/s.  States hurts worse depending on moves made and is a burning sensation.

## 2017-01-26 NOTE — Discharge Instructions (Signed)
As discussed, your evaluation today has been largely reassuring.  But, it is important that you monitor your condition carefully, and do not hesitate to return to the ED if you develop new, or concerning changes in your condition. ? ?Otherwise, please follow-up with your physician for appropriate ongoing care. ? ?

## 2017-03-20 ENCOUNTER — Ambulatory Visit (HOSPITAL_COMMUNITY)
Admission: RE | Admit: 2017-03-20 | Discharge: 2017-03-20 | Disposition: A | Discharge status: Home / Self Care | Payer: Medicare HMO | Source: Ambulatory Visit | Attending: Family Medicine | Admitting: Family Medicine

## 2017-03-20 ENCOUNTER — Other Ambulatory Visit (HOSPITAL_COMMUNITY): Payer: Self-pay | Admitting: Family Medicine

## 2017-03-20 DIAGNOSIS — M25551 Pain in right hip: Secondary | ICD-10-CM | POA: Diagnosis present

## 2017-03-20 DIAGNOSIS — M5136 Other intervertebral disc degeneration, lumbar region: Secondary | ICD-10-CM | POA: Diagnosis not present

## 2017-03-20 DIAGNOSIS — M545 Low back pain, unspecified: Secondary | ICD-10-CM

## 2017-07-16 IMAGING — US US EXTREM LOW VENOUS*L*
1 series · 13 of 24 positions shown · non-contrast
Comparison: None.

CLINICAL DATA: 54-year-old male with left lower extremity pain and
varicose veins



[Series 1: us extrem low venous*left* · 0.11mm/px · 13 of 38 slices shown]
[im 1/38]
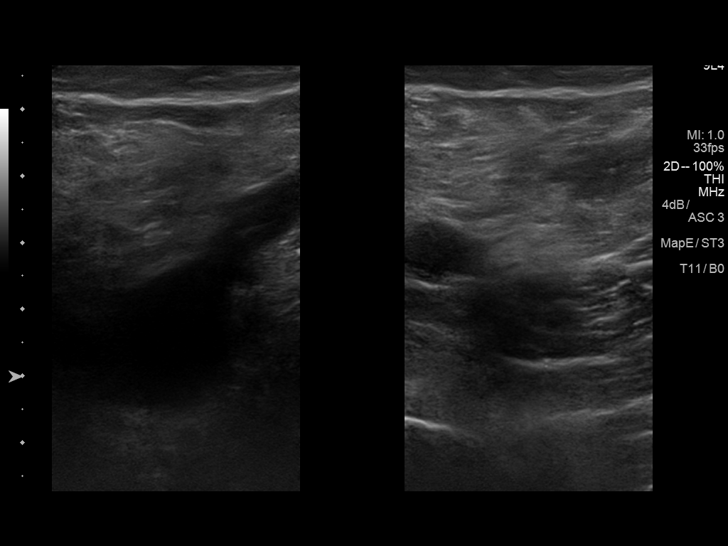
[im 4/38]
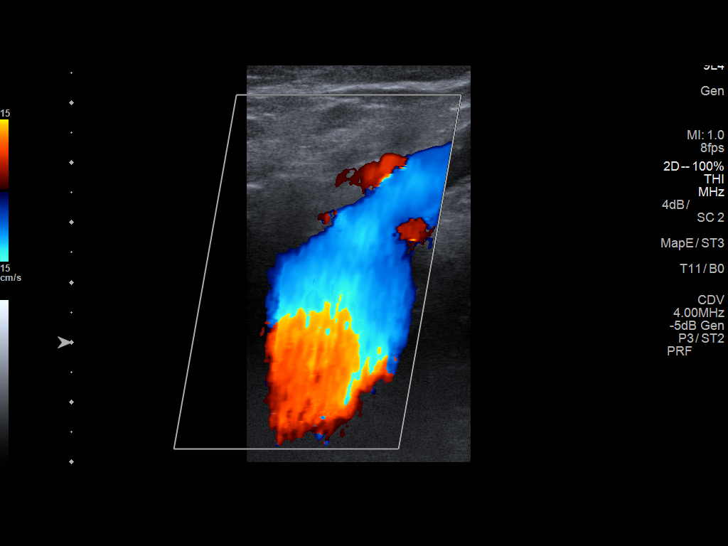
[im 7/38]
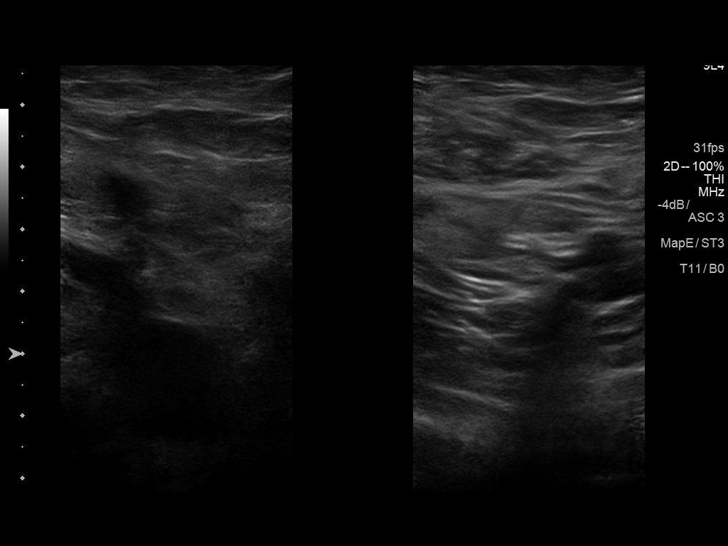
[im 10/38]
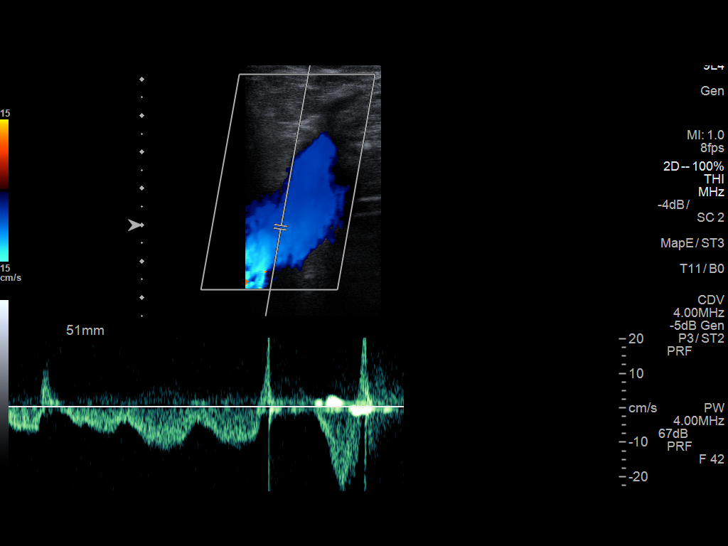
[im 13/38]
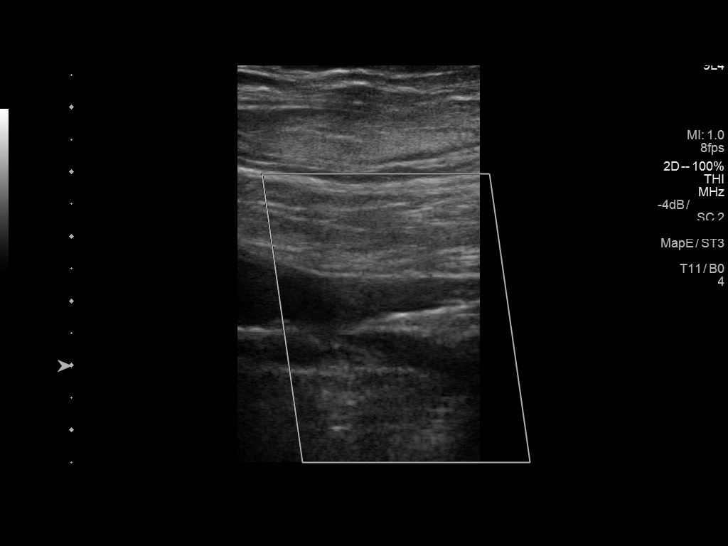
[im 17/38]
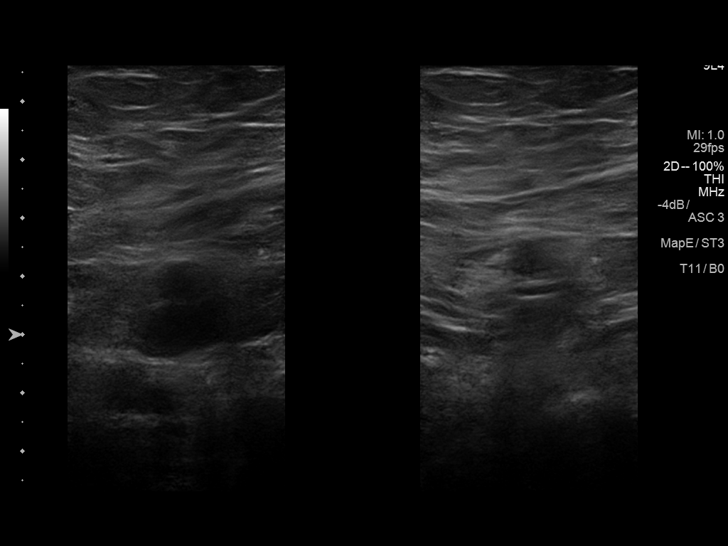
[im 20/38]
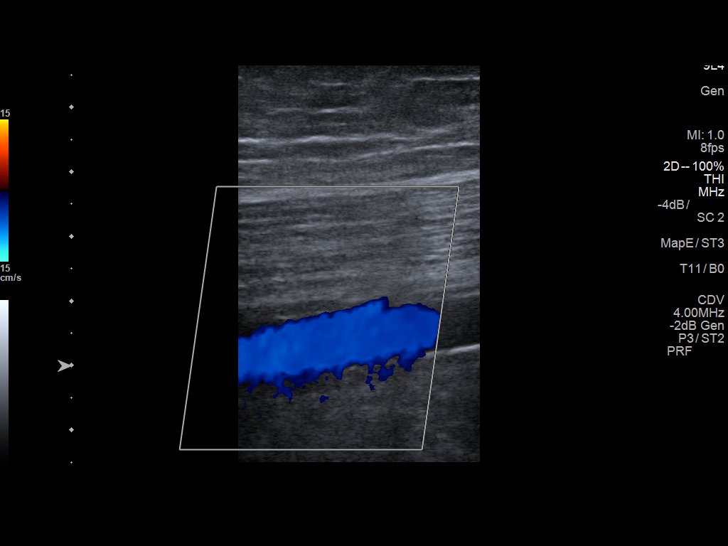
[im 21/38]
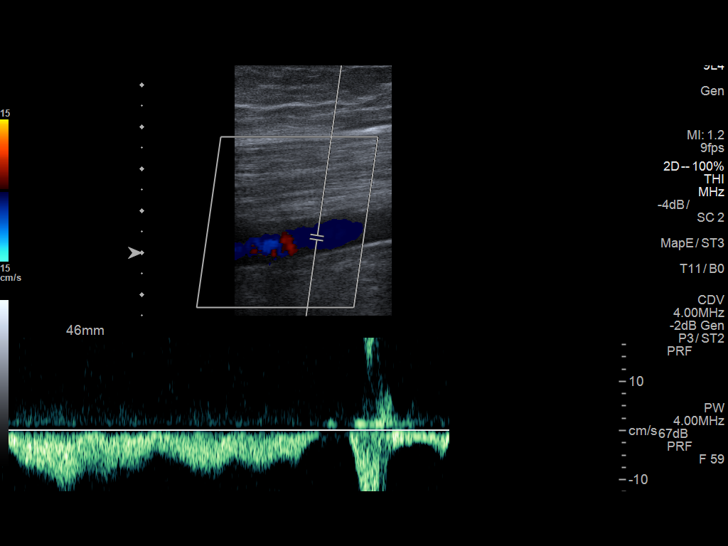
[im 25/38]
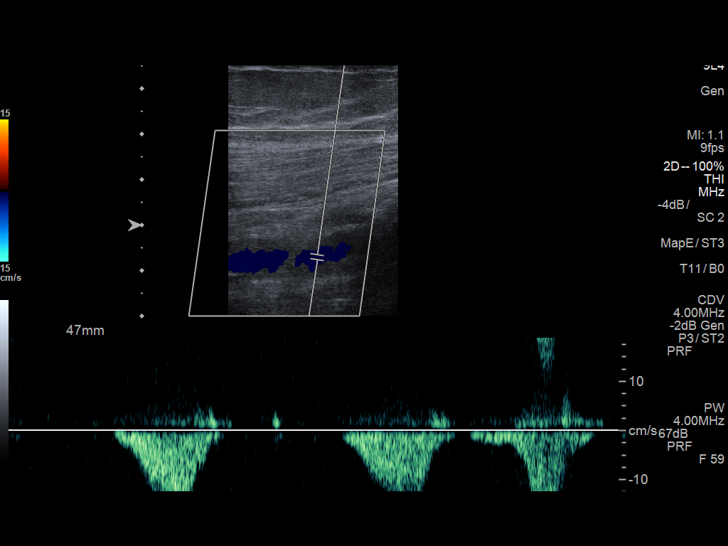
[im 28/38]
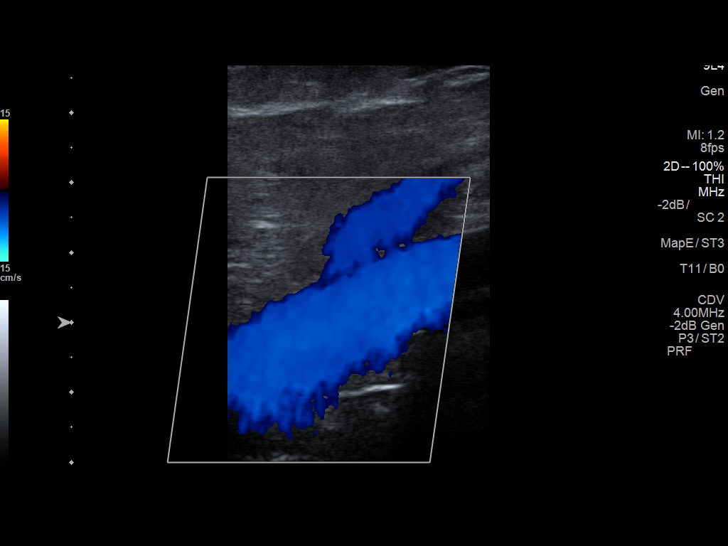
[im 31/38]
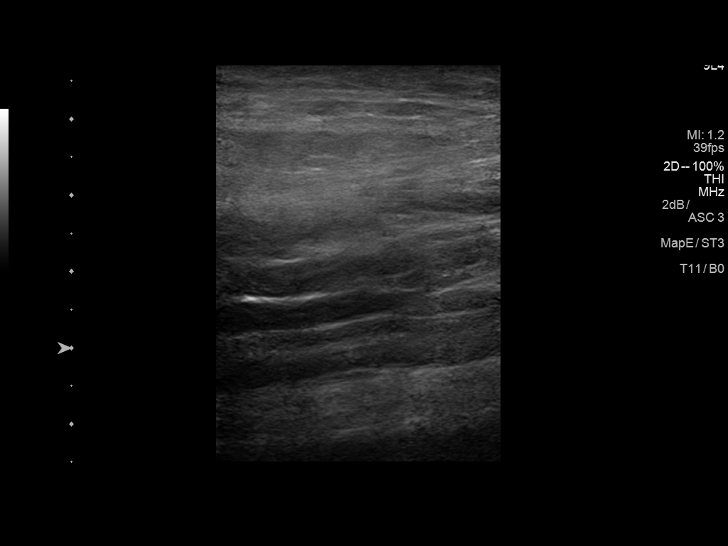
[im 34/38]
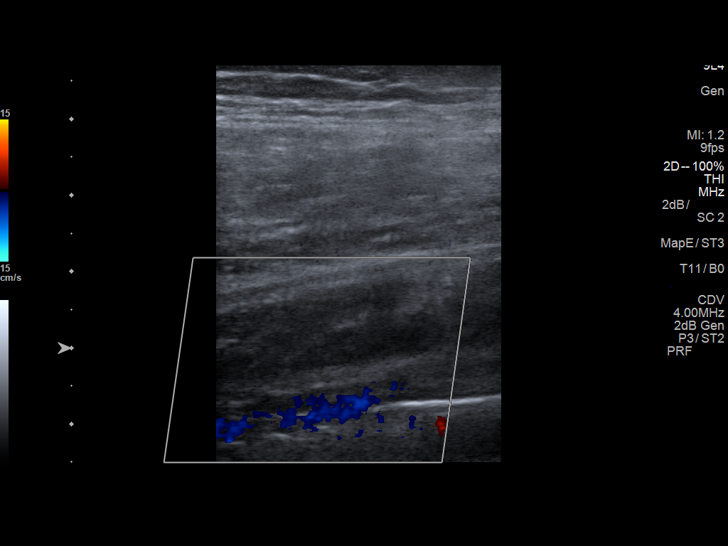
[im 38/38]
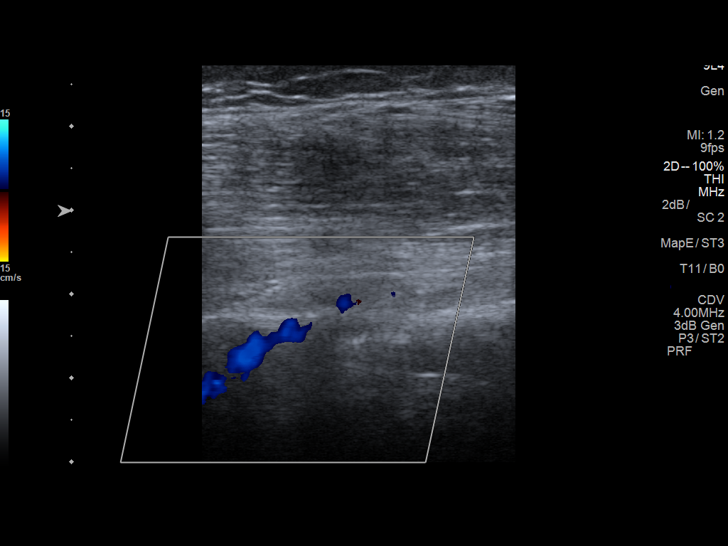

[13 of 24 positions shown; findings below may reference images not displayed]

FINDINGS: Contralateral Common Femoral Vein: Respiratory phasicity is normal
and symmetric with the symptomatic side. No evidence of thrombus.
Normal compressibility.

Common Femoral Vein: No evidence of thrombus. Normal
compressibility, respiratory phasicity and response to augmentation.

Saphenofemoral Junction: No evidence of thrombus. Normal
compressibility and flow on color Doppler imaging.

Profunda Femoral Vein: No evidence of thrombus. Normal
compressibility and flow on color Doppler imaging.

Femoral Vein: No evidence of thrombus. Normal compressibility,
respiratory phasicity and response to augmentation.

Popliteal Vein: No evidence of thrombus. Normal compressibility,
respiratory phasicity and response to augmentation.

Calf Veins: No evidence of thrombus. Normal compressibility and flow
on color Doppler imaging.

Superficial Great Saphenous Vein: No evidence of thrombus. Normal
compressibility and flow on color Doppler imaging.

Venous Reflux:  None.

Other Findings: Superficial venous varicosities are noted along the
course of the somewhat dilated great saphenous vein.
IMPRESSION: 1. No evidence of deep venous thrombosis.
2. Dilated great saphenous vein with adjacent superficial venous
varicosities. This is highly suspicious for underlying venous
insufficiency. Consider referral to [REDACTED] or other vascular specialist for further evaluation and
management.

## 2017-11-04 ENCOUNTER — Other Ambulatory Visit (HOSPITAL_COMMUNITY): Payer: Self-pay | Admitting: Family Medicine

## 2017-11-04 ENCOUNTER — Ambulatory Visit (HOSPITAL_COMMUNITY)
Admission: RE | Admit: 2017-11-04 | Discharge: 2017-11-04 | Disposition: A | Discharge status: Home / Self Care | Payer: Medicare HMO | Source: Ambulatory Visit | Attending: Family Medicine | Admitting: Family Medicine

## 2017-11-04 DIAGNOSIS — M79605 Pain in left leg: Secondary | ICD-10-CM | POA: Diagnosis present

## 2018-01-27 ENCOUNTER — Telehealth: Payer: Self-pay | Admitting: Orthopedic Surgery

## 2018-01-27 NOTE — Telephone Encounter (Signed)
Patient called and canceled the appointment for February 01, 2018.  He will call us back when he is ready to reschedule

## 2018-02-01 ENCOUNTER — Ambulatory Visit: Payer: Medicare HMO | Admitting: Orthopedic Surgery

## 2018-02-01 ENCOUNTER — Encounter: Payer: Self-pay | Admitting: Orthopedic Surgery

## 2018-05-26 ENCOUNTER — Emergency Department (HOSPITAL_COMMUNITY): Payer: Medicare HMO

## 2018-05-26 ENCOUNTER — Emergency Department (HOSPITAL_COMMUNITY)
Admission: EM | Admit: 2018-05-26 | Discharge: 2018-05-26 | Disposition: A | Discharge status: Home / Self Care | Payer: Medicare HMO | Attending: Emergency Medicine | Admitting: Emergency Medicine

## 2018-05-26 ENCOUNTER — Other Ambulatory Visit: Payer: Self-pay

## 2018-05-26 ENCOUNTER — Encounter (HOSPITAL_COMMUNITY): Payer: Self-pay | Admitting: Emergency Medicine

## 2018-05-26 DIAGNOSIS — Z7901 Long term (current) use of anticoagulants: Secondary | ICD-10-CM | POA: Diagnosis not present

## 2018-05-26 DIAGNOSIS — J4521 Mild intermittent asthma with (acute) exacerbation: Secondary | ICD-10-CM | POA: Diagnosis not present

## 2018-05-26 DIAGNOSIS — I10 Essential (primary) hypertension: Secondary | ICD-10-CM | POA: Insufficient documentation

## 2018-05-26 DIAGNOSIS — Z91040 Latex allergy status: Secondary | ICD-10-CM | POA: Insufficient documentation

## 2018-05-26 DIAGNOSIS — J181 Lobar pneumonia, unspecified organism: Secondary | ICD-10-CM | POA: Diagnosis not present

## 2018-05-26 DIAGNOSIS — J189 Pneumonia, unspecified organism: Secondary | ICD-10-CM

## 2018-05-26 DIAGNOSIS — R05 Cough: Secondary | ICD-10-CM | POA: Diagnosis present

## 2018-05-26 DIAGNOSIS — Z79899 Other long term (current) drug therapy: Secondary | ICD-10-CM | POA: Diagnosis not present

## 2018-05-26 MED ORDER — IPRATROPIUM-ALBUTEROL 0.5-2.5 (3) MG/3ML IN SOLN
3.0000 mL | Freq: Once | RESPIRATORY_TRACT | Status: AC
  Administered 2018-05-26: 3 mL via RESPIRATORY_TRACT
  Filled 2018-05-26: qty 3

## 2018-05-26 MED ORDER — PREDNISONE 50 MG PO TABS
60.0000 mg | ORAL_TABLET | Freq: Once | ORAL | Status: AC
  Administered 2018-05-26: 60 mg via ORAL
  Filled 2018-05-26: qty 1

## 2018-05-26 MED ORDER — DOXYCYCLINE HYCLATE 100 MG PO CAPS: 100.0000 mg | ORAL_CAPSULE | Freq: Two times a day (BID) | ORAL | 0 refills | Status: AC

## 2018-05-26 NOTE — Discharge Instructions (Signed)
You were evaluated today for cough.  Your chest x-ray did show that you have pneumonia.  I have prescribed you doxycycline.  Please take this twice a day for the next week.  Please continue to use your home nebulizer and rescue inhaler as needed for your asthma.  Follow-up with your primary care provider within the week for reevaluation.  Return to the ED for new or worsening symptoms such as: Contact a doctor if: You have a fever. You lose sleep because your cough medicine does not help. Get help right away if: You are short of breath and it gets worse. You have more chest pain. Your sickness gets worse. This is very serious if: You are an older adult. Your body's defense system is weak. You cough up blood.

## 2018-05-26 NOTE — ED Notes (Signed)
Respiratory called

## 2018-05-26 NOTE — ED Triage Notes (Signed)
Patient complaining of cough x 3 weeks. States he was treated for same with PCP and given antibiotics and nebulizer treatments. States he is coughing up yellow sputum, cough is worse, and is short of breath and wheezing.

## 2018-05-26 NOTE — ED Provider Notes (Signed)
[facility] EMERGENCY DEPARTMENT Provider Note   CSN: [id number] Arrival date & time: 05/26/18  1044  History   Chief Complaint Chief Complaint  Patient presents with  . Cough    HPI Parker Rodriguez is a 57 y.o. male with a past medical history significant for hypertension, asthma who presents for evaluation of cough.  Patient states he has had a cough of productive yellow phlegm over the last 3 days.  States his symptoms started with a runny nose and nasal congestion approximately 3 weeks ago.  Patient states he had a short course of amoxicillin at that time.  States he thought his symptoms resolved however approximately 1 week ago he started to "feel rundown."  Patient states that his cough has "triggered his asthma."  States that he has had to use his home nebulizer.  Patient states he has used his rescue inhaler this morning because he was coughing so bad he could not catch his breath.  Denies fever, chills, nausea, vomiting, chest pain, abdominal pain, diarrhea.  Denies aggravating or alleviating factors.  States he feels like he has been "wheezy" since he woke up this morning.  History obtained from patient.  No interpreter was used.  HPI  Past Medical History:  Diagnosis Date  . Allergic rhinitis   . Anxiety   . Arthritis   . Asthma   . Carpal tunnel syndrome   . Depressive reaction   . Hypercholesterolemia   . Hypertension    benign essentia  . Intervertebral disc disorder   . Obesity   . Paroxysmal vertigo   . Peptic ulcer    remote past    Patient Active Problem List   Diagnosis Date Noted  . Rectal bleeding 08/24/2011  . KNEE, ARTHRITIS, DEGEN./OSTEO 02/20/2010  . VIRAL URI 04/02/2009  . DYSPNEA ON EXERTION 11/29/2008  . DEGENERATIVE DISC DISEASE, LUMBOSACRAL SPINE W/RADICULOPATHY 06/25/2007  . FASTING HYPERGLYCEMIA 06/25/2007  . Carpal tunnel syndrome 04/27/2007  . DEPRESSION/ANXIETY 01/11/2007  . HYPERLIPIDEMIA 07/04/2006  . MORBID OBESITY 07/04/2006  .  HYPERTENSION 07/04/2006  . HEMORRHOIDS 07/04/2006  . ALLERGIC RHINITIS 07/04/2006  . GERD 07/04/2006  . 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 OSTEOARTHRITIS 07/04/2006    Past Surgical History:  Procedure Laterality Date  . COLONOSCOPY  09/11/2011   Procedure: COLONOSCOPY;  Surgeon: Daneil Dolin, MD;  Location: [facility];  Service: Endoscopy;  Laterality: N/A;  9:15  . HAND SURGERY     left        Home Medications    Prior to Admission medications   Medication Sig Start Date End Date Taking? Authorizing Provider  beclomethasone (QVAR) 80 MCG/ACT inhaler Inhale 2 puffs into the lungs 2 (two) times daily as needed (shortness of breath).     [provider]  betamethasone dipropionate (DIPROLENE) 0.05 % cream Apply 1 application topically 2 (two) times daily as needed (itching).  04/14/14   [provider]  cetirizine (ZYRTEC) 10 MG tablet Take 1 tablet by mouth daily. 05/25/14   [provider]  doxycycline (VIBRAMYCIN) 100 MG capsule Take 1 capsule (100 mg total) by mouth 2 (two) times daily for 7 days. 05/26/18 06/02/18  Rydell Wiegel A, PA-C  fluticasone (FLONASE) 50 MCG/ACT nasal spray Place 1 spray into the nose daily.    [provider]  furosemide (LASIX) 40 MG tablet Take 1 tablet by mouth daily as needed for fluid.  06/05/14   [provider]  HYDROcodone-acetaminophen (NORCO/VICODIN) 5-325 MG tablet Take 1-2  tablets by mouth every 6 (six) hours as needed. Patient taking differently: Take 1-2 tablets by mouth every 6 (six) hours as needed for moderate pain.  03/18/16   Ward, Delice Bison, DO  ibuprofen (ADVIL,MOTRIN) 800 MG tablet Take 1 tablet (800 mg total) by mouth every 8 (eight) hours as needed for mild pain. 03/18/16   Ward, Delice Bison, DO  latanoprost (XALATAN) 0.005 % ophthalmic solution Place 1 drop into both eyes at bedtime. 02/19/16   [provider]  omeprazole (PRILOSEC) 40 MG capsule Take 1 capsule Daily FOR  ACID REFLUX 01/04/17   [provider]  polyethylene glycol powder (GLYCOLAX/MIRALAX) powder Take 17 g by mouth daily. Patient taking differently: Take 17 g by mouth daily as needed for mild constipation.  08/30/12   Annitta Needs, NP  potassium chloride SA (K-DUR,KLOR-CON) 20 MEQ tablet Take 20 mEq by mouth every morning.  08/12/11   [provider]  predniSONE (DELTASONE) 20 MG tablet Take 2 tablets (40 mg total) by mouth daily with breakfast. For the next four days 01/26/17   Carmin Muskrat, MD  valsartan-hydrochlorothiazide (DIOVAN-HCT) 80-12.5 MG tablet Take 1 tablet by mouth daily. 02/09/16   [provider]    Family History Family History  Problem Relation Age of Onset  . Colon cancer Paternal Uncle   . Pneumonia Father        died at age 108  . Heart disease Unknown   . Asthma Unknown   . Diabetes Unknown   . Hypertension Mother   . Hypertension Other     Social History Social History   Tobacco Use  . Smoking status: Never Smoker  . Smokeless tobacco: Never Used  Substance Use Topics  . Alcohol use: Yes    Comment: glass of wine every now and then  . Drug use: No     Allergies   Latex   Review of Systems Review of Systems  Constitutional: Negative.   HENT: Negative.   Respiratory: Positive for cough and wheezing.   Cardiovascular: Negative for chest pain.  Gastrointestinal: Negative.   Genitourinary: Negative.   Musculoskeletal: Negative.   Skin: Negative.   All other systems reviewed and are negative.    Physical Exam Updated Vital Signs BP 121/65 (BP Location: Right Arm)   Pulse 84   Temp 98.3 F (36.8 C) (Oral)   Resp 18   Ht 5\' 11"  (1.803 m)   Wt 117.9 kg   SpO2 96%   BMI 36.26 kg/m   Physical Exam  Constitutional: Vital signs are normal. He appears well-developed and well-nourished.  Non-toxic appearance. He does not have a sickly appearance. He does not appear ill. No distress.  HENT:  Head: Atraumatic.  Right  Ear: Tympanic membrane, external ear and ear canal normal.  Left Ear: Tympanic membrane, external ear and ear canal normal.  Nose: Nose normal. No mucosal edema, rhinorrhea, sinus tenderness or nasal deformity. Right sinus exhibits no maxillary sinus tenderness and no frontal sinus tenderness. Left sinus exhibits no maxillary sinus tenderness and no frontal sinus tenderness.  Mouth/Throat: Uvula is midline, oropharynx is clear and moist and mucous membranes are normal. No trismus in the jaw. No uvula swelling. No oropharyngeal exudate, posterior oropharyngeal edema, posterior oropharyngeal erythema or tonsillar abscesses. No tonsillar exudate.  Eyes: Pupils are equal, round, and reactive to light.  Neck: Normal range of motion, full passive range of motion without pain and phonation normal. Neck supple. No neck rigidity. No edema, no erythema and  normal range of motion present.  Cardiovascular: Normal rate, regular rhythm, normal heart sounds, intact distal pulses and normal pulses. Exam reveals no gallop and no friction rub.  No murmur heard. Pulmonary/Chest: Effort normal. No accessory muscle usage or stridor. No tachypnea. No respiratory distress. He has no decreased breath sounds. He has no rhonchi. He has no rales.  Diffuse inspiratory and expiratory wheezing.  Patient is not tachypneic.  There is no accessory muscle usage.  No rhonchi or rales.  No decreased breath sounds.  Abdominal: Soft. Bowel sounds are normal. He exhibits no distension and no mass. There is no tenderness. There is no guarding.  Musculoskeletal: Normal range of motion.  Neurological: He is alert.  Skin: Skin is warm and dry. He is not diaphoretic.  Psychiatric: He has a normal mood and affect.  Nursing note and vitals reviewed.    ED Treatments / Results  Labs (all labs ordered are listed, but only abnormal results are displayed) Labs Reviewed - No data to display  EKG None  Radiology Dg Chest 2 View  Result  Date: 05/26/2018 CLINICAL DATA:  Cough. EXAM: CHEST - 2 VIEW COMPARISON:  03/23/2016. FINDINGS: Mediastinum hilar structures normal. Minimal infiltrate in the right upper lung. No pleural effusion or pneumothorax. Heart size normal. No acute bony abnormality. IMPRESSION: Minimal infiltrate noted in the right upper lung suggesting mild pneumonia. Electronically Signed   By: Marcello Moores  Register   On: 05/26/2018 11:35    Procedures Procedures (including critical care time)  Medications Ordered in ED Medications  predniSONE (DELTASONE) tablet 60 mg (60 mg Oral Given 05/26/18 1230)  ipratropium-albuterol (DUONEB) 0.5-2.5 (3) MG/3ML nebulizer solution 3 mL (3 mLs Nebulization Given 05/26/18 1257)  ipratropium-albuterol (DUONEB) 0.5-2.5 (3) MG/3ML nebulizer solution 3 mL (3 mLs Nebulization Given 05/26/18 1403)     Initial Impression / Assessment and Plan / ED Course  I have reviewed the triage vital signs and the nursing notes.  Pertinent labs & imaging results that were available during my care of the patient were reviewed by me and considered in my medical decision making (see chart for details).  57 year old male who appears otherwise well presents for evaluation of productive cough and wheeze.  Afebrile, nonseptic, non-ill-appearing. On initial presentation, no evidence of acute respiratory distress.  No accessory muscle usage.  Patient is able to speak in full sentences without difficulty.  There is no tachypnea.  No rales, rhonchi or decreased breath sounds on exam.  Oxygen saturation 94% on room air.  Will obtain chest x-ray and DuoNeb with steroids and reevaluate.  1145: Chest x-ray with minimal infiltrate noted in the right upper lung suggesting mild pneumonia.  1330: Patient with improved breath sounds, however there is still mild diffuse expiratory wheezing.  Will give additional DuoNeb and reevaluate.   1440: On reevaluation patient with clear lung sounds.  No evidence of acute respiratory  distress. Oxygen saturation 96% on room air with good waveform.  Able to ambulate with oxygen saturations 94 to 96%.  No evidence of retractions or tachypnea.  Patient states he "feels better" and like to DC home.  Patient with acute pneumonia as well as asthma exacerbation.  Discussed with patient antibiotics for pneumonia as well as use of home nebulizer and rescue inhaler as needed.  Discussed with patient strict return precautions.  Patient voiced understanding is agreeable for dc home. Stable for dc home at this time.    Final Clinical Impressions(s) / ED Diagnoses   Final diagnoses:  Community  acquired pneumonia of left upper lobe of lung ([place])  Mild intermittent asthma with exacerbation    ED Discharge Orders         Ordered    doxycycline (VIBRAMYCIN) 100 MG capsule  2 times daily     05/26/18 1434           Loni Delbridge A, PA-C 05/26/18 1457    Fredia Sorrow, MD 05/28/18 1254

## 2018-08-11 ENCOUNTER — Ambulatory Visit (HOSPITAL_COMMUNITY)
Admission: RE | Admit: 2018-08-11 | Discharge: 2018-08-11 | Disposition: A | Discharge status: Home / Self Care | Payer: Medicare HMO | Source: Ambulatory Visit | Attending: Nurse Practitioner | Admitting: Nurse Practitioner

## 2018-08-11 ENCOUNTER — Other Ambulatory Visit (HOSPITAL_COMMUNITY): Payer: Self-pay | Admitting: Nurse Practitioner

## 2018-08-11 ENCOUNTER — Other Ambulatory Visit (HOSPITAL_COMMUNITY)
Admission: RE | Admit: 2018-08-11 | Discharge: 2018-08-11 | Disposition: A | Discharge status: Home / Self Care | Payer: Medicare HMO | Source: Ambulatory Visit | Attending: Nurse Practitioner | Admitting: Nurse Practitioner

## 2018-08-11 DIAGNOSIS — R0602 Shortness of breath: Secondary | ICD-10-CM

## 2018-08-11 LAB — CBC WITH DIFFERENTIAL/PLATELET
Abs Immature Granulocytes: 0.01 10*3/uL (ref 0.00–0.07)
Basophils Absolute: 0.1 10*3/uL (ref 0.0–0.1)
Basophils Relative: 1 %
EOS ABS: 0.6 10*3/uL — AB (ref 0.0–0.5)
Eosinophils Relative: 10 %
HCT: 47.6 % (ref 39.0–52.0)
Hemoglobin: 15.4 g/dL (ref 13.0–17.0)
Immature Granulocytes: 0 %
Lymphocytes Relative: 36 %
Lymphs Abs: 2 10*3/uL (ref 0.7–4.0)
MCH: 28.9 pg (ref 26.0–34.0)
MCHC: 32.4 g/dL (ref 30.0–36.0)
MCV: 89.3 fL (ref 80.0–100.0)
Monocytes Absolute: 0.5 10*3/uL (ref 0.1–1.0)
Monocytes Relative: 9 %
Neutro Abs: 2.5 10*3/uL (ref 1.7–7.7)
Neutrophils Relative %: 44 %
Platelets: 210 10*3/uL (ref 150–400)
RBC: 5.33 MIL/uL (ref 4.22–5.81)
RDW: 13.7 % (ref 11.5–15.5)
WBC: 5.7 10*3/uL (ref 4.0–10.5)
nRBC: 0 % (ref 0.0–0.2)

## 2019-04-20 IMAGING — DX DG CHEST 2V
2 series · 2 of 2 positions shown · non-contrast
Comparison: 03/23/2016.

CLINICAL DATA: Cough.

EXAM:
CHEST - 2 VIEW

[chest pa]
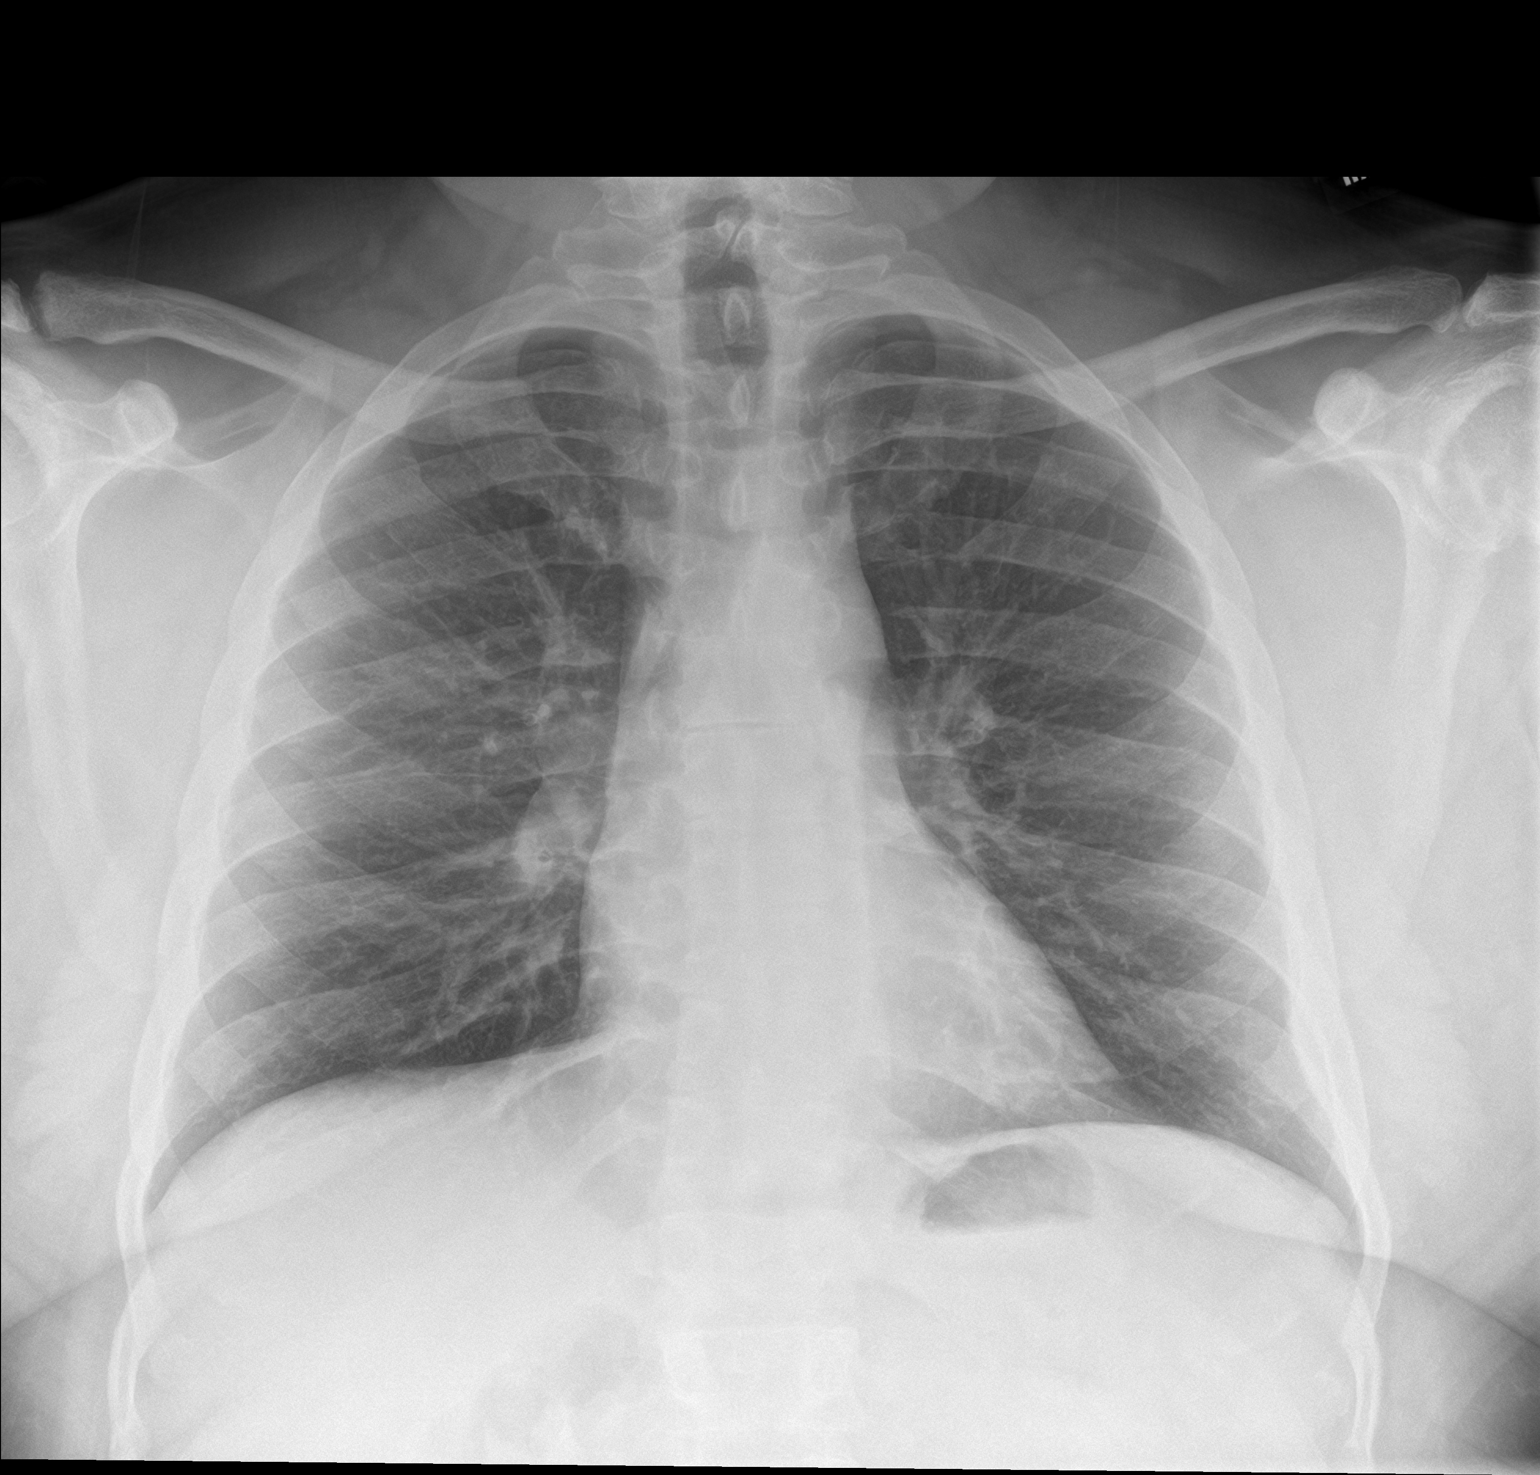

[chest lat]
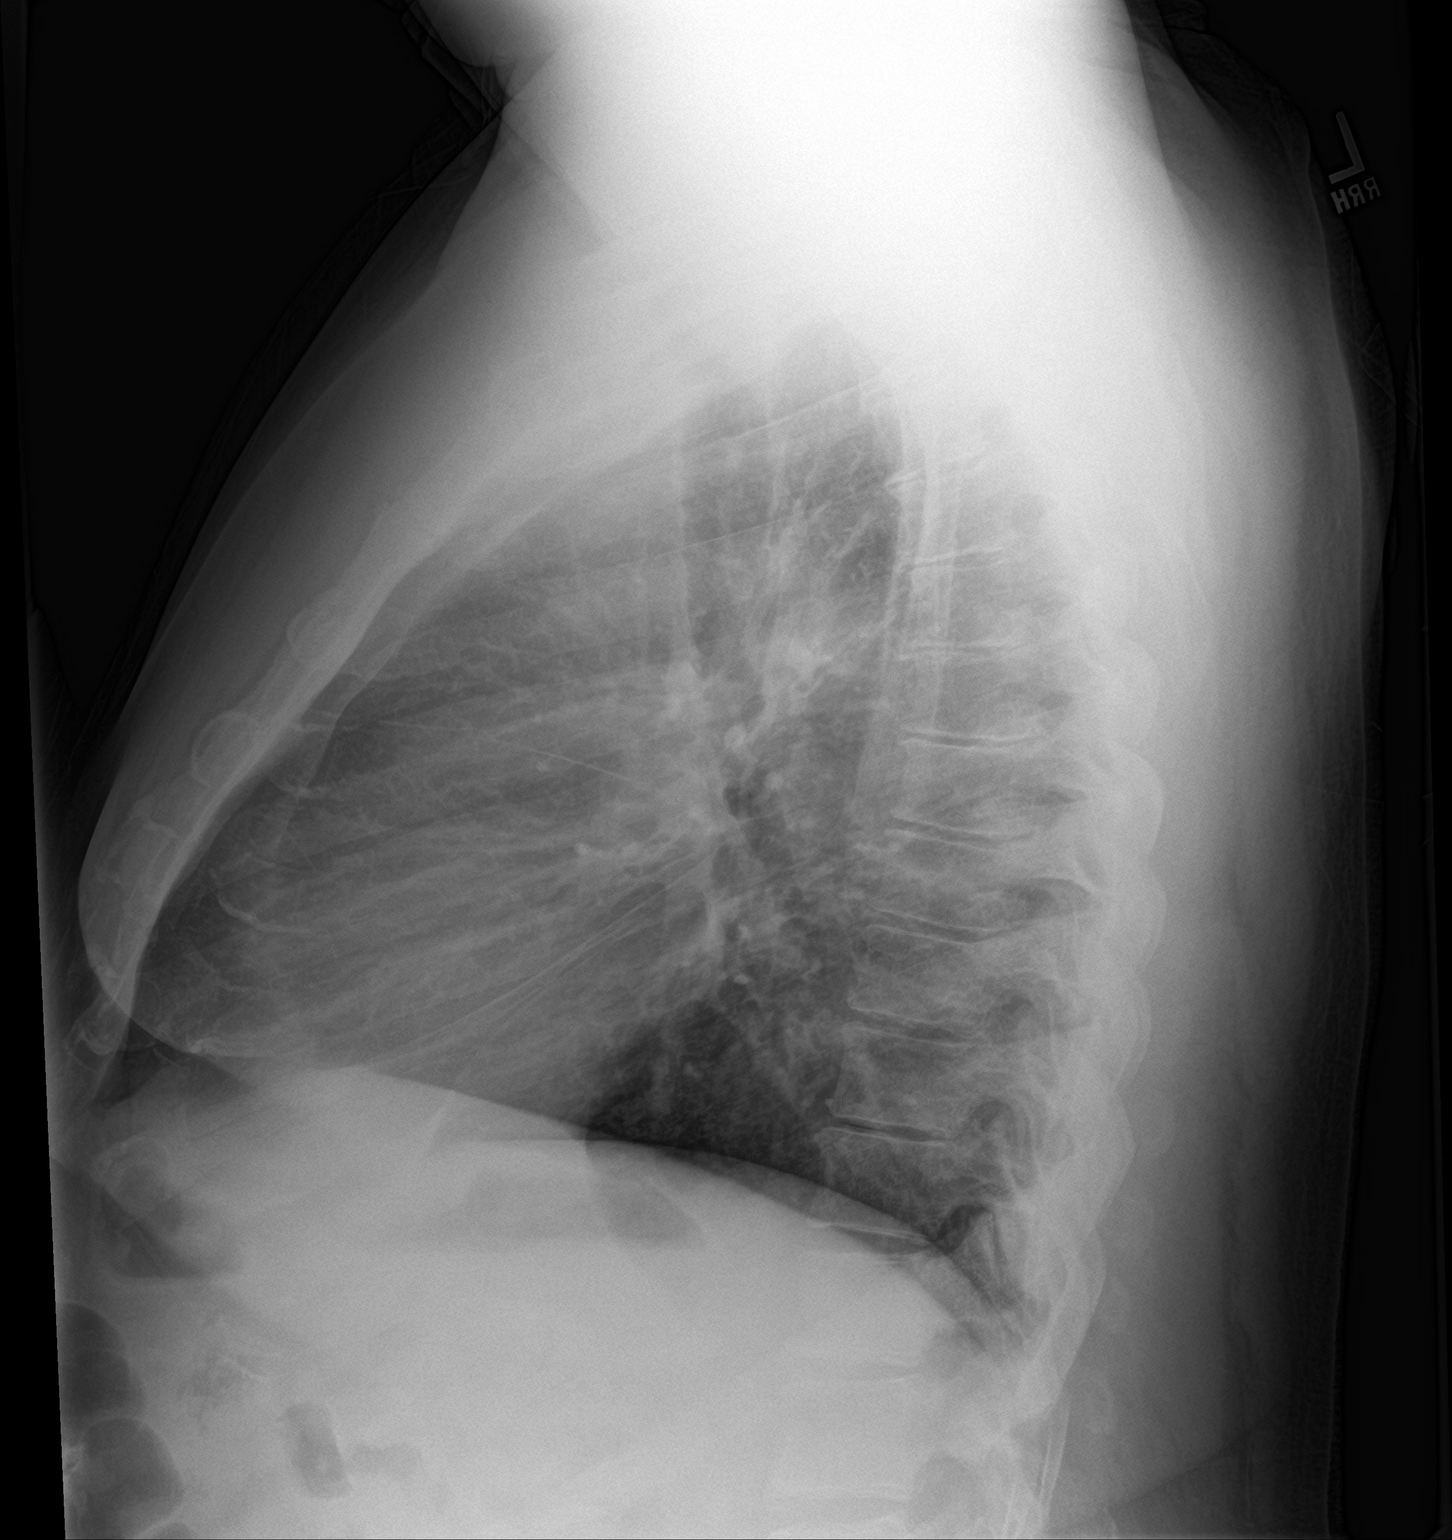

[2 of 2 positions shown; findings below may reference images not displayed]

FINDINGS: Mediastinum hilar structures normal. Minimal infiltrate in the right
upper lung. No pleural effusion or pneumothorax. Heart size normal.
No acute bony abnormality.
IMPRESSION: Minimal infiltrate noted in the right upper lung suggesting mild
pneumonia.

## 2019-09-20 ENCOUNTER — Ambulatory Visit: Payer: Medicare HMO | Attending: Internal Medicine

## 2019-09-20 DIAGNOSIS — Z23 Encounter for immunization: Secondary | ICD-10-CM | POA: Insufficient documentation

## 2019-09-20 NOTE — Progress Notes (Signed)
   Covid-19 Vaccination Clinic  Name:  Parker Rodriguez    MRN: [id number] DOB: 1960/10/14  09/20/2019  Mr. Parker Rodriguez was observed post Covid-19 immunization for 15 minutes without incident. He was provided with Vaccine Information Sheet and instruction to access the V-Safe system.   Mr. Parker Rodriguez was instructed to call 911 with any severe reactions post vaccine: Marland Kitchen Difficulty breathing  . Swelling of face and throat  . A fast heartbeat  . A bad rash all over body  . Dizziness and weakness   Immunizations Administered    Name Date Dose VIS Date Route   Moderna COVID-19 Vaccine 09/20/2019 11:33 AM 0.5 mL 06/21/2019 Intramuscular   Manufacturer: Moderna   Lot: [id number]   [id number]

## 2019-10-18 ENCOUNTER — Ambulatory Visit: Payer: Medicare HMO | Attending: Internal Medicine

## 2019-10-18 DIAGNOSIS — Z23 Encounter for immunization: Secondary | ICD-10-CM

## 2019-10-18 NOTE — Progress Notes (Signed)
   Covid-19 Vaccination Clinic  Name:  Parker Rodriguez    MRN: [id number] DOB: Oct 25, 1960  10/18/2019  Mr. [name] was observed post Covid-19 immunization for 15 minutes without incident. He was provided with Vaccine Information Sheet and instruction to access the V-Safe system.   Mr. [name] was instructed to call 911 with any severe reactions post vaccine: Marland Kitchen Difficulty breathing  . Swelling of face and throat  . A fast heartbeat  . A bad rash all over body  . Dizziness and weakness   Immunizations Administered    Name Date Dose VIS Date Route   Moderna COVID-19 Vaccine 10/18/2019 11:24 AM 0.5 mL 06/21/2019 Intramuscular   Manufacturer: Moderna   Lot: [id number]   [id number]

## 2020-02-01 ENCOUNTER — Ambulatory Visit (HOSPITAL_COMMUNITY)
Admission: RE | Admit: 2020-02-01 | Discharge: 2020-02-01 | Disposition: A | Discharge status: Home / Self Care | Payer: Medicare HMO | Source: Ambulatory Visit | Attending: Family Medicine | Admitting: Family Medicine

## 2020-02-01 ENCOUNTER — Other Ambulatory Visit: Payer: Self-pay

## 2020-02-01 ENCOUNTER — Other Ambulatory Visit (HOSPITAL_COMMUNITY): Payer: Self-pay | Admitting: Family Medicine

## 2020-02-01 DIAGNOSIS — M5440 Lumbago with sciatica, unspecified side: Secondary | ICD-10-CM | POA: Insufficient documentation

## 2020-02-01 DIAGNOSIS — M25551 Pain in right hip: Secondary | ICD-10-CM | POA: Diagnosis present

## 2020-02-14 ENCOUNTER — Other Ambulatory Visit: Payer: Self-pay | Admitting: Family Medicine

## 2020-02-14 DIAGNOSIS — M545 Low back pain, unspecified: Secondary | ICD-10-CM

## 2020-03-11 ENCOUNTER — Other Ambulatory Visit: Payer: Medicare HMO

## 2020-05-22 ENCOUNTER — Telehealth: Payer: Self-pay | Admitting: Orthopedic Surgery

## 2020-05-22 NOTE — Telephone Encounter (Signed)
Patient called to inquire about appointment for left knee - discussed new coverage - it is [facility], which requires referral for specialist visit from primary care. Voiced understanding. To call his primary care, Dr Karie Kirks.

## 2020-05-31 ENCOUNTER — Ambulatory Visit: Payer: Medicare HMO | Admitting: Orthopedic Surgery

## 2020-06-06 ENCOUNTER — Ambulatory Visit: Payer: Medicare HMO | Admitting: Orthopedic Surgery

## 2020-06-20 ENCOUNTER — Ambulatory Visit: Payer: Medicare HMO | Admitting: Orthopedic Surgery

## 2020-07-04 ENCOUNTER — Other Ambulatory Visit: Payer: Self-pay

## 2020-07-04 ENCOUNTER — Ambulatory Visit (INDEPENDENT_AMBULATORY_CARE_PROVIDER_SITE_OTHER): Payer: Medicare HMO | Admitting: Orthopedic Surgery

## 2020-07-04 ENCOUNTER — Encounter: Payer: Self-pay | Admitting: Orthopedic Surgery

## 2020-07-04 ENCOUNTER — Ambulatory Visit: Payer: Medicare HMO

## 2020-07-04 VITALS — BP 139/76 | HR 89 | Ht 71.0 in | Wt 288.0 lb

## 2020-07-04 DIAGNOSIS — G8929 Other chronic pain: Secondary | ICD-10-CM

## 2020-07-04 DIAGNOSIS — M1710 Unilateral primary osteoarthritis, unspecified knee: Secondary | ICD-10-CM

## 2020-07-04 DIAGNOSIS — M1712 Unilateral primary osteoarthritis, left knee: Secondary | ICD-10-CM

## 2020-07-04 DIAGNOSIS — M25562 Pain in left knee: Secondary | ICD-10-CM

## 2020-07-04 MED ORDER — MELOXICAM 7.5 MG PO TABS: 7.5000 mg | ORAL_TABLET | Freq: Every day | ORAL | 5 refills | Status: DC

## 2020-07-04 NOTE — Progress Notes (Addendum)
Progress Note   Patient ID: Parker Rodriguez, male   DOB: 1960/08/17, 59 y.o.   MRN: [id number] C. MANAGEMENT   Recommend weight loss injection medication change  Meds ordered this encounter  Medications  . meloxicam (MOBIC) 7.5 MG tablet    Sig: Take 1 tablet (7.5 mg total) by mouth daily.    Dispense:  30 tablet    Refill:  5    Body mass index is 40.17 kg/m.  Chief Complaint  Patient presents with  . Knee Pain    Left     Encounter Diagnoses  Name Primary?  . Chronic pain of left knee   . Primary localized osteoarthritis of knee Yes    59 year old male followed for osteoarthritis left knee presents with persistent pain swelling decreased range of motion difficulty getting out of a seated position and occasionally has trouble walking  Currently on hydrocodone and ibuprofen for pain    Review of Systems  Musculoskeletal: Positive for back pain and joint pain.  Neurological: Loss of consciousness: .pmh.    Past Medical History:  Diagnosis Date  . Allergic rhinitis   . Anxiety   . Arthritis   . Asthma   . Carpal tunnel syndrome   . Depressive reaction   . Hypercholesterolemia   . Hypertension    benign essentia  . Intervertebral disc disorder   . Obesity   . Paroxysmal vertigo   . Peptic ulcer    remote past        BP 139/76   Pulse 89   Ht 5\' 11"  (1.803 m)   Wt 288 lb (130.6 kg)   BMI 40.17 kg/m   Physical Exam Constitutional:      Appearance: Normal appearance.  Cardiovascular:     Rate and Rhythm: Normal rate.  Musculoskeletal:     Comments: Left knee small effusion slight decrease in extension about 3 to 4 degrees flexion 120 degrees knee stable tenderness lateral joint line strength and muscle tone normal  Skin:    Capillary Refill: Capillary refill takes less than 2 seconds.     Comments: Skin discoloration secondary to venous stasis disease  Neurological:     General: No focal deficit present.     Mental Status: He is alert and  oriented to person, place, and time.  Psychiatric:        Mood and Affect: Mood normal.      MEDICAL DECISION MAKING Encounter Diagnoses  Name Primary?  . Chronic pain of left knee   . Primary localized osteoarthritis of knee Yes    DATA ANALYSED:  IMAGING:  X-rays today show valgus arthritis of the left knee no major changes since his last x-ray does have some peripheral osteophytes around the patella and femur  Plan  Weight loss Change from ibuprofen to meloxicam Inject left knee  Procedure note left knee injection   verbal consent was obtained to inject left knee joint  Timeout was completed to confirm the site of injection  The medications used were 40 mg of Depo-Medrol and 1% lidocaine 3 cc  Anesthesia was provided by ethyl chloride and the skin was prepped with alcohol.  After cleaning the skin with alcohol a 20-gauge needle was used to inject the left knee joint. There were no complications. A sterile bandage was applied.   Make appointment for right hip and spine x-rays  Meds ordered this encounter  Medications  . meloxicam (MOBIC) 7.5 MG tablet    Sig: Take 1 tablet (7.5  mg total) by mouth daily.    Dispense:  30 tablet    Refill:  5     Arther Abbott, MD 07/05/2020 11:38 AM

## 2020-07-04 NOTE — Patient Instructions (Signed)
Weight loss   Change ibuprofen to meloxicam   Injection left knee  Make appt for hip back xrays    You have received an injection of steroids into the joint. 15% of patients will have increased pain within the 24 hours postinjection.   This is transient and will go away.   We recommend that you use ice packs on the injection site for 20 minutes every 2 hours and extra strength Tylenol 2 tablets every 8 as needed until the pain resolves.  If you continue to have pain after taking the Tylenol and using the ice please call the office for further instructions.

## 2020-07-18 ENCOUNTER — Ambulatory Visit: Payer: Medicare HMO | Admitting: Orthopedic Surgery

## 2020-07-18 NOTE — Progress Notes (Deleted)
Patient ID: Parker Rodriguez, male   DOB: 05-18-1961, 59 y.o.   MRN: [id number]    ASSESSMENT AND PLAN:  **  No chief complaint on file.   HPI Parker Rodriguez is a 59 y.o. male.  ***  Review of Systems Review of Systems  Past Medical History:  Diagnosis Date  . Allergic rhinitis   . Anxiety   . Arthritis   . Asthma   . Carpal tunnel syndrome   . Depressive reaction   . Hypercholesterolemia   . Hypertension    benign essentia  . Intervertebral disc disorder   . Obesity   . Paroxysmal vertigo   . Peptic ulcer    remote past    Past Surgical History:  Procedure Laterality Date  . COLONOSCOPY  09/11/2011   Procedure: COLONOSCOPY;  Surgeon: Daneil Dolin, MD;  Location: [facility];  Service: Endoscopy;  Laterality: N/A;  9:15  . HAND SURGERY     left    Family History  Problem Relation Age of Onset  . Colon cancer Paternal Uncle   . Pneumonia Father        died at age 31  . Heart disease Unknown   . Asthma Unknown   . Diabetes Unknown   . Hypertension Mother   . Hypertension Other    was reviewed  Social History Social History   Tobacco Use  . Smoking status: Never Smoker  . Smokeless tobacco: Never Used  Substance Use Topics  . Alcohol use: Yes    Comment: glass of wine every now and then  . Drug use: No    Allergies  Allergen Reactions  . Latex Other (See Comments)    Smell causes congestion     Current Outpatient Medications  Medication Sig Dispense Refill  . atorvastatin (LIPITOR) 40 MG tablet Take 20-40 mg by mouth daily.    . beclomethasone (QVAR) 80 MCG/ACT inhaler Inhale 2 puffs into the lungs 2 (two) times daily as needed (shortness of breath).     . betamethasone dipropionate (DIPROLENE) 0.05 % cream Apply 1 application topically 2 (two) times daily as needed (itching).     . cetirizine (ZYRTEC) 10 MG tablet Take 1 tablet by mouth daily.  11  . esomeprazole (NEXIUM) 40 MG capsule Take 40 mg by mouth daily.    . fluticasone  (FLONASE) 50 MCG/ACT nasal spray Place 1 spray into the nose daily.    . furosemide (LASIX) 40 MG tablet Take 1 tablet by mouth daily as needed for fluid.   11  . HYDROcodone-acetaminophen (NORCO/VICODIN) 5-325 MG tablet Take 1-2 tablets by mouth every 6 (six) hours as needed. (Patient taking differently: Take 1-2 tablets by mouth every 6 (six) hours as needed for moderate pain.) 10 tablet 0  . hydrOXYzine (ATARAX/VISTARIL) 25 MG tablet SMARTSIG:1 Tablet(s) By Mouth 1 to 3 Times Daily    . ibuprofen (ADVIL,MOTRIN) 800 MG tablet Take 1 tablet (800 mg total) by mouth every 8 (eight) hours as needed for mild pain. 30 tablet 0  . latanoprost (XALATAN) 0.005 % ophthalmic solution Place 1 drop into both eyes at bedtime.  3  . meloxicam (MOBIC) 7.5 MG tablet Take 1 tablet (7.5 mg total) by mouth daily. 30 tablet 5  . omeprazole (PRILOSEC) 40 MG capsule Take 1 capsule Daily FOR ACID REFLUX  11  . polyethylene glycol powder (GLYCOLAX/MIRALAX) powder Take 17 g by mouth daily. (Patient taking differently: Take 17 g by mouth daily as needed for mild  constipation.) 255 g 3  . potassium chloride SA (K-DUR,KLOR-CON) 20 MEQ tablet Take 20 mEq by mouth every morning.    . predniSONE (DELTASONE) 20 MG tablet Take 2 tablets (40 mg total) by mouth daily with breakfast. For the next four days 8 tablet 0  . SYMBICORT 160-4.5 MCG/ACT inhaler SMARTSIG:2 Puff(s) By Mouth Twice Daily    . valsartan-hydrochlorothiazide (DIOVAN-HCT) 80-12.5 MG tablet Take 1 tablet by mouth daily.  3   No current facility-administered medications for this visit.       Physical Exam There were no vitals taken for this visit.  Gen. appearance: The patient is well-developed and well-nourished grooming and hygiene are normal The patient is oriented to person place and time The patient's mood is normal and the affect is normal   Gait assessment: The patient stands with *** normal gait and station  Lumbar spine Tenderness  to palpation  is noted in the lower L4-5 and 5 S1 segment  Range of motion  *** Muscle tone   *** normal on the right and left sides of the spine  Lower extremities right and left Normal range of motion hip knee and ankle All 3 joints are reduced and stable  Strength right lower extremity L2-S1 NORMAL EXCEPT *** Strength left lower extremity L2-S1 NORMAL EXCEPT ***  Neurologic right lower extremity examination  Reflexes were 2+ and equal at the knee and 1+ and equal at the ankle    Sensation was normal in both feet and legs    Babinski's tests were down going  Straight leg raise testing   The vascular examination revealed normal dorsalis pedis pulses in both feet and both feet were warm with good capillary refill    MEDICAL DECISION MAKING  A. No diagnosis found.  B. DATA ANALYSED:  ***  IMAGING: Independent interpretation of images: ***  Orders: ***  Outside records reviewed: ***  C. MANAGEMENT ***  No orders of the defined types were placed in this encounter.

## 2020-09-19 DIAGNOSIS — M545 Low back pain, unspecified: Secondary | ICD-10-CM | POA: Diagnosis not present

## 2020-09-19 DIAGNOSIS — J019 Acute sinusitis, unspecified: Secondary | ICD-10-CM | POA: Diagnosis not present

## 2020-09-19 DIAGNOSIS — I10 Essential (primary) hypertension: Secondary | ICD-10-CM | POA: Diagnosis not present

## 2020-09-19 DIAGNOSIS — Z79891 Long term (current) use of opiate analgesic: Secondary | ICD-10-CM | POA: Diagnosis not present

## 2020-09-27 ENCOUNTER — Ambulatory Visit: Payer: Medicare HMO | Admitting: Orthopedic Surgery

## 2020-10-08 ENCOUNTER — Ambulatory Visit: Payer: Medicare HMO | Admitting: Orthopedic Surgery

## 2020-10-22 ENCOUNTER — Other Ambulatory Visit: Payer: Self-pay | Admitting: Orthopedic Surgery

## 2020-10-22 DIAGNOSIS — G8929 Other chronic pain: Secondary | ICD-10-CM

## 2020-10-22 DIAGNOSIS — M25562 Pain in left knee: Secondary | ICD-10-CM

## 2020-11-20 DIAGNOSIS — H401111 Primary open-angle glaucoma, right eye, mild stage: Secondary | ICD-10-CM | POA: Diagnosis not present

## 2020-12-10 DIAGNOSIS — M545 Low back pain, unspecified: Secondary | ICD-10-CM | POA: Diagnosis not present

## 2020-12-10 DIAGNOSIS — B360 Pityriasis versicolor: Secondary | ICD-10-CM | POA: Diagnosis not present

## 2020-12-10 DIAGNOSIS — M8660 Other chronic osteomyelitis, unspecified site: Secondary | ICD-10-CM | POA: Diagnosis not present

## 2020-12-10 DIAGNOSIS — Z125 Encounter for screening for malignant neoplasm of prostate: Secondary | ICD-10-CM | POA: Diagnosis not present

## 2020-12-10 DIAGNOSIS — Z833 Family history of diabetes mellitus: Secondary | ICD-10-CM | POA: Diagnosis not present

## 2020-12-10 DIAGNOSIS — E039 Hypothyroidism, unspecified: Secondary | ICD-10-CM | POA: Diagnosis not present

## 2020-12-10 DIAGNOSIS — I10 Essential (primary) hypertension: Secondary | ICD-10-CM | POA: Diagnosis not present

## 2020-12-10 DIAGNOSIS — M25562 Pain in left knee: Secondary | ICD-10-CM | POA: Diagnosis not present

## 2020-12-10 DIAGNOSIS — E7800 Pure hypercholesterolemia, unspecified: Secondary | ICD-10-CM | POA: Diagnosis not present

## 2020-12-24 ENCOUNTER — Telehealth: Payer: Self-pay | Admitting: Radiology

## 2020-12-24 ENCOUNTER — Ambulatory Visit: Payer: Medicare HMO | Admitting: Orthopedic Surgery

## 2020-12-24 ENCOUNTER — Other Ambulatory Visit: Payer: Self-pay

## 2020-12-24 ENCOUNTER — Encounter: Payer: Self-pay | Admitting: Orthopedic Surgery

## 2020-12-24 VITALS — Ht 70.0 in | Wt 274.0 lb

## 2020-12-24 DIAGNOSIS — M1712 Unilateral primary osteoarthritis, left knee: Secondary | ICD-10-CM

## 2020-12-24 DIAGNOSIS — M25562 Pain in left knee: Secondary | ICD-10-CM

## 2020-12-24 DIAGNOSIS — M1710 Unilateral primary osteoarthritis, unspecified knee: Secondary | ICD-10-CM

## 2020-12-24 NOTE — Progress Notes (Signed)
   Routine follow-up visit  Chief Complaint  Patient presents with  . Knee Pain    L/hurting and I would like an injection.     Parker Rodriguez is 60 years old complaining of severe left knee pain he has a history of obesity but has lost 20 pounds his BMI is now 42 He would like to have an injection in the left knee  Procedure left knee injection  Inject 40 mg Depo-Medrol mixed knee joint this was well tolerated without complications skin is clean with ethyl chloride alcohol  Verbal consent was given and site confirmation was confirmed by [facility]  Follow-up will be determined based on whether or not his insurance injuries hyaluronic acid injection

## 2020-12-24 NOTE — Telephone Encounter (Signed)
[employer] Needs [place]  Can you look at this see what is covered I told him to expect call and we will let him know out of pocket expenses

## 2020-12-25 NOTE — Telephone Encounter (Signed)
Submitted online [employer] for BV.  [employer] PA form to be stamped/faxed per Amy.

## 2020-12-28 NOTE — Telephone Encounter (Signed)
Have faxed

## 2021-02-12 DIAGNOSIS — G4709 Other insomnia: Secondary | ICD-10-CM | POA: Diagnosis not present

## 2021-02-12 DIAGNOSIS — E6609 Other obesity due to excess calories: Secondary | ICD-10-CM | POA: Diagnosis not present

## 2021-02-12 DIAGNOSIS — J301 Allergic rhinitis due to pollen: Secondary | ICD-10-CM | POA: Diagnosis not present

## 2021-02-12 DIAGNOSIS — Z638 Other specified problems related to primary support group: Secondary | ICD-10-CM | POA: Diagnosis not present

## 2021-02-20 DIAGNOSIS — J209 Acute bronchitis, unspecified: Secondary | ICD-10-CM | POA: Diagnosis not present

## 2021-03-12 DIAGNOSIS — E7800 Pure hypercholesterolemia, unspecified: Secondary | ICD-10-CM | POA: Diagnosis not present

## 2021-03-12 DIAGNOSIS — I10 Essential (primary) hypertension: Secondary | ICD-10-CM | POA: Diagnosis not present

## 2021-03-12 DIAGNOSIS — Z79891 Long term (current) use of opiate analgesic: Secondary | ICD-10-CM | POA: Diagnosis not present

## 2021-03-12 DIAGNOSIS — M25562 Pain in left knee: Secondary | ICD-10-CM | POA: Diagnosis not present

## 2021-04-11 ENCOUNTER — Ambulatory Visit: Payer: Medicare HMO | Admitting: Orthopedic Surgery

## 2021-04-11 ENCOUNTER — Encounter: Payer: Self-pay | Admitting: Orthopedic Surgery

## 2021-04-11 ENCOUNTER — Other Ambulatory Visit: Payer: Self-pay

## 2021-04-11 VITALS — Ht 71.0 in | Wt 270.0 lb

## 2021-04-11 DIAGNOSIS — M25562 Pain in left knee: Secondary | ICD-10-CM

## 2021-04-11 DIAGNOSIS — G8929 Other chronic pain: Secondary | ICD-10-CM | POA: Diagnosis not present

## 2021-04-11 DIAGNOSIS — M1710 Unilateral primary osteoarthritis, unspecified knee: Secondary | ICD-10-CM

## 2021-04-11 DIAGNOSIS — M1712 Unilateral primary osteoarthritis, left knee: Secondary | ICD-10-CM

## 2021-04-11 NOTE — Progress Notes (Signed)
Chief Complaint  Patient presents with   Knee Pain    Left knee pain, wants another injection today.    A steroid injection was performed at left knee  using 1% plain Lidocaine and 6  mg of Celestone. This was well tolerated.   Encounter Diagnoses  Name Primary?   Chronic pain of left knee Yes   Primary localized osteoarthritis of knee    F/u prn

## 2021-06-11 DIAGNOSIS — Z79891 Long term (current) use of opiate analgesic: Secondary | ICD-10-CM | POA: Diagnosis not present

## 2021-06-11 DIAGNOSIS — M25562 Pain in left knee: Secondary | ICD-10-CM | POA: Diagnosis not present

## 2021-06-11 DIAGNOSIS — I10 Essential (primary) hypertension: Secondary | ICD-10-CM | POA: Diagnosis not present

## 2021-06-11 DIAGNOSIS — J019 Acute sinusitis, unspecified: Secondary | ICD-10-CM | POA: Diagnosis not present

## 2021-06-11 DIAGNOSIS — Z23 Encounter for immunization: Secondary | ICD-10-CM | POA: Diagnosis not present

## 2021-06-19 DIAGNOSIS — E785 Hyperlipidemia, unspecified: Secondary | ICD-10-CM | POA: Diagnosis not present

## 2021-07-08 ENCOUNTER — Ambulatory Visit: Payer: Medicare HMO | Admitting: Orthopedic Surgery

## 2021-08-13 DIAGNOSIS — G514 Facial myokymia: Secondary | ICD-10-CM | POA: Diagnosis not present

## 2021-09-11 ENCOUNTER — Other Ambulatory Visit (HOSPITAL_COMMUNITY): Payer: Self-pay | Admitting: Family Medicine

## 2021-09-11 DIAGNOSIS — R200 Anesthesia of skin: Secondary | ICD-10-CM

## 2021-09-23 ENCOUNTER — Other Ambulatory Visit: Payer: Self-pay

## 2021-09-23 ENCOUNTER — Ambulatory Visit (HOSPITAL_COMMUNITY)
Admission: RE | Admit: 2021-09-23 | Discharge: 2021-09-23 | Disposition: A | Discharge status: Home / Self Care | Payer: Medicare HMO | Source: Ambulatory Visit | Attending: Family Medicine | Admitting: Family Medicine

## 2021-09-23 DIAGNOSIS — R200 Anesthesia of skin: Secondary | ICD-10-CM | POA: Insufficient documentation

## 2021-09-26 ENCOUNTER — Other Ambulatory Visit: Payer: Self-pay | Admitting: Family Medicine

## 2021-09-26 DIAGNOSIS — R200 Anesthesia of skin: Secondary | ICD-10-CM

## 2021-09-26 DIAGNOSIS — M5440 Lumbago with sciatica, unspecified side: Secondary | ICD-10-CM

## 2021-10-04 ENCOUNTER — Other Ambulatory Visit: Payer: Self-pay

## 2021-10-04 ENCOUNTER — Encounter: Payer: Self-pay | Admitting: General Surgery

## 2021-10-04 ENCOUNTER — Ambulatory Visit (INDEPENDENT_AMBULATORY_CARE_PROVIDER_SITE_OTHER): Payer: Medicare HMO | Admitting: General Surgery

## 2021-10-04 VITALS — BP 122/73 | HR 77 | Temp 97.6°F | Resp 14 | Ht 71.0 in | Wt 279.0 lb

## 2021-10-04 DIAGNOSIS — M6208 Separation of muscle (nontraumatic), other site: Secondary | ICD-10-CM | POA: Diagnosis not present

## 2021-10-04 NOTE — Progress Notes (Signed)
Parker Rodriguez; [id number]; 1960-10-23 ? ? ?HPI ?Patient is a 61 year old black male who was referred to my care by Dr. Lemmie Evens for evaluation and treatment of a ventral hernia.  Patient states he has had upper abdominal swelling when he sits up for many years.  He is also complaining of some mild epigastric discomfort.  He does occasionally over the past few weeks have some fatty food intolerance and nausea.  He was recently seen by Dr. Karie Kirks and had some laboratory test done.  He has never had abdominal surgery. ?Past Medical History:  ?Diagnosis Date  ? Allergic rhinitis   ? Anxiety   ? Arthritis   ? Asthma   ? Carpal tunnel syndrome   ? Depressive reaction   ? Hypercholesterolemia   ? Hypertension   ? benign essentia  ? Intervertebral disc disorder   ? Obesity   ? Paroxysmal vertigo   ? Peptic ulcer   ? remote past  ? ? ?Past Surgical History:  ?Procedure Laterality Date  ? COLONOSCOPY  09/11/2011  ? Procedure: COLONOSCOPY;  Surgeon: Daneil Dolin, MD;  Location: [facility];  Service: Endoscopy;  Laterality: N/A;  9:15  ? HAND SURGERY    ? left  ? ? ?Family History  ?Problem Relation Age of Onset  ? Colon cancer Paternal Uncle   ? Pneumonia Father   ?     died at age 71  ? Heart disease Unknown   ? Asthma Unknown   ? Diabetes Unknown   ? Hypertension Mother   ? Hypertension Other   ? ? ?Current Outpatient Medications on File Prior to Visit  ?Medication Sig Dispense Refill  ? atorvastatin (LIPITOR) 40 MG tablet Take 20-40 mg by mouth daily.    ? beclomethasone (QVAR) 80 MCG/ACT inhaler Inhale 2 puffs into the lungs 2 (two) times daily as needed (shortness of breath).     ? betamethasone dipropionate (DIPROLENE) 0.05 % cream Apply 1 application topically 2 (two) times daily as needed (itching).     ? cetirizine (ZYRTEC) 10 MG tablet Take 1 tablet by mouth daily.  11  ? diazepam (VALIUM) 10 MG tablet Take by mouth.    ? esomeprazole (NEXIUM) 40 MG capsule Take 40 mg by mouth daily.    ? fluticasone  (FLONASE) 50 MCG/ACT nasal spray Place 1 spray into the nose daily.    ? furosemide (LASIX) 40 MG tablet Take 1 tablet by mouth daily as needed for fluid.   11  ? HYDROcodone-acetaminophen (NORCO/VICODIN) 5-325 MG tablet Take 1-2 tablets by mouth every 6 (six) hours as needed. (Patient taking differently: Take 1-2 tablets by mouth every 6 (six) hours as needed for moderate pain.) 10 tablet 0  ? hydrOXYzine (ATARAX/VISTARIL) 25 MG tablet SMARTSIG:1 Tablet(s) By Mouth 1 to 3 Times Daily    ? ibuprofen (ADVIL,MOTRIN) 800 MG tablet Take 1 tablet (800 mg total) by mouth every 8 (eight) hours as needed for mild pain. 30 tablet 0  ? latanoprost (XALATAN) 0.005 % ophthalmic solution Place 1 drop into both eyes at bedtime.  3  ? meloxicam (MOBIC) 7.5 MG tablet TAKE 1 TABLET(7.5 MG) BY MOUTH DAILY 30 tablet 5  ? omeprazole (PRILOSEC) 40 MG capsule Take 1 capsule Daily FOR ACID REFLUX  11  ? polyethylene glycol powder (GLYCOLAX/MIRALAX) powder Take 17 g by mouth daily. (Patient taking differently: Take 17 g by mouth daily as needed for mild constipation.) 255 g 3  ? potassium chloride SA (K-DUR,KLOR-CON) 20  MEQ tablet Take 20 mEq by mouth every morning.    ? SYMBICORT 160-4.5 MCG/ACT inhaler SMARTSIG:2 Puff(s) By Mouth Twice Daily    ? valsartan-hydrochlorothiazide (DIOVAN-HCT) 80-12.5 MG tablet Take 1 tablet by mouth daily.  3  ? ?No current facility-administered medications on file prior to visit.  ? ? ?Allergies  ?Allergen Reactions  ? Latex Other (See Comments)  ?  Smell causes congestion   ? ? ?Social History  ? ?Substance and Sexual Activity  ?Alcohol Use Yes  ? Comment: glass of wine every now and then  ? ? ?Social History  ? ?Tobacco Use  ?Smoking Status Never  ?Smokeless Tobacco Never  ? ? ?Review of Systems  ?Constitutional: Negative.   ?HENT:  Positive for sinus pain.   ?Eyes: Negative.   ?Respiratory:  Positive for wheezing.   ?Cardiovascular: Negative.   ?Gastrointestinal:  Positive for abdominal pain, heartburn  and nausea.  ?Genitourinary: Negative.   ?Musculoskeletal:  Positive for back pain, joint pain and neck pain.  ?Skin: Negative.   ?Neurological:  Positive for sensory change.  ?Endo/Heme/Allergies: Negative.   ?Psychiatric/Behavioral: Negative.    ? ?Objective  ? ?Vitals:  ? 10/04/21 1104  ?BP: 122/73  ?Pulse: 77  ?Resp: 14  ?Temp: 97.6 ?F (36.4 ?C)  ?SpO2: 95%  ? ? ?Physical Exam ?Vitals reviewed.  ?Constitutional:   ?   Appearance: Normal appearance. He is obese. He is not ill-appearing.  ?HENT:  ?   Head: Normocephalic and atraumatic.  ?Cardiovascular:  ?   Rate and Rhythm: Normal rate and regular rhythm.  ?   Heart sounds: Normal heart sounds. No murmur heard. ?  No friction rub. No gallop.  ?Pulmonary:  ?   Effort: Pulmonary effort is normal. No respiratory distress.  ?   Breath sounds: Normal breath sounds. No stridor. No wheezing, rhonchi or rales.  ?Abdominal:  ?   General: Bowel sounds are normal. There is no distension.  ?   Palpations: Abdomen is soft. There is no mass.  ?   Tenderness: There is no abdominal tenderness. There is no guarding or rebound.  ?   Hernia: No hernia is present.  ?   Comments: Large diastases recti.  No hernias appreciated.  No right upper quadrant abdominal pain noted.  ?Skin: ?   General: Skin is warm and dry.  ?Neurological:  ?   Mental Status: He is alert and oriented to person, place, and time.  ? ?Primary care notes reviewed ?Assessment  ?Diastases recti, no ventral hernia present. ?Intermittent gastric bloating with heartburn and some fatty food intolerance. ?Plan  ?I told the patient about his diastases recti.  No need for surgical intervention.  Literature was given concerning toning exercises.  I also told him that should his upper abdominal discomfort and fatty food intolerance worsen, he may need an ultrasound of the right upper quadrant to rule out biliary colic.  He will do this through his primary care physician.  Follow-up here as needed. ?

## 2021-10-20 ENCOUNTER — Ambulatory Visit
Admission: RE | Admit: 2021-10-20 | Discharge: 2021-10-20 | Disposition: A | Discharge status: Home / Self Care | Payer: Medicare HMO | Source: Ambulatory Visit | Attending: Family Medicine | Admitting: Family Medicine

## 2021-10-20 DIAGNOSIS — R200 Anesthesia of skin: Secondary | ICD-10-CM

## 2021-10-20 DIAGNOSIS — M5440 Lumbago with sciatica, unspecified side: Secondary | ICD-10-CM

## 2022-02-03 ENCOUNTER — Telehealth: Payer: Self-pay | Admitting: Radiology

## 2022-02-03 NOTE — Telephone Encounter (Signed)
Patient called, LM asking for a call back, I called, NA, LMVM.

## 2022-04-16 NOTE — Progress Notes (Signed)
No chief complaint on file.

## 2022-11-27 ENCOUNTER — Ambulatory Visit (INDEPENDENT_AMBULATORY_CARE_PROVIDER_SITE_OTHER): Payer: Medicare HMO | Admitting: Orthopedic Surgery

## 2022-11-27 ENCOUNTER — Encounter: Payer: Self-pay | Admitting: Orthopedic Surgery

## 2022-11-27 ENCOUNTER — Other Ambulatory Visit (INDEPENDENT_AMBULATORY_CARE_PROVIDER_SITE_OTHER): Payer: Medicare HMO

## 2022-11-27 VITALS — Ht 71.0 in | Wt 283.0 lb

## 2022-11-27 DIAGNOSIS — M25562 Pain in left knee: Secondary | ICD-10-CM | POA: Diagnosis not present

## 2022-11-27 DIAGNOSIS — M1712 Unilateral primary osteoarthritis, left knee: Secondary | ICD-10-CM | POA: Diagnosis not present

## 2022-11-27 DIAGNOSIS — G8929 Other chronic pain: Secondary | ICD-10-CM

## 2022-11-27 MED ORDER — METHYLPREDNISOLONE ACETATE 40 MG/ML IJ SUSP
40.0000 mg | Freq: Once | INTRAMUSCULAR | Status: AC
  Administered 2022-11-27: 40 mg via INTRA_ARTICULAR

## 2022-11-27 NOTE — Progress Notes (Addendum)
Chief Complaint  Patient presents with   Knee Pain    Left pain has gotten worse, constant now.     Ht 5\' 11"  (1.803 m)   Wt 283 lb (128.4 kg)   BMI 39.30 kg/m   62 year old male worsening arthritis pain left knee  Last visit injection 2022 September  Current medications for knee pain medications listed include meloxicam 7.5 daily I also see hydrocodone 5 mg and also see ibuprofen 800 mg  His complaints are increasing pain worsening deformity night pain and the pain is waking him up at night  Exam shows that he has the appearance that the knee is going in the valgus he has a moderate effusion he has lost 5 degrees of extension he can flex the knee to 115 is stable  I x-rayed him and then did an aspiration and an injection  He needs to lose about 15 pounds we discussed diet control  Procedure note injection and aspiration left knee joint  Verbal consent was obtained to aspirate and inject the left knee joint   Timeout was completed to confirm the site of aspiration and injection  An 18-gauge needle was used to aspirate the left knee joint from a suprapatellar lateral approach.  The medications used were 40 mg of Depo-Medrol and 1% lidocaine 3 cc  Anesthesia was provided by ethyl chloride and the skin was prepped with alcohol.  After cleaning the skin with alcohol an 18-gauge needle was used to aspirate the right knee joint.  We obtained 20 cc of fluid CLR YEL  We followed this by injection of 40 mg of Depo-Medrol and 3 cc 1% lidocaine.  There were no complications. A sterile bandage was applied.    Return 1 month check weight make plans for elective left total knee arthroplasty  Today's x-rays show valgus alignment to the knee worsening lateral compartment and medial compartment disease but global disease overall with grade 4 x-ray changes  Note he is ambulatory

## 2022-12-08 ENCOUNTER — Telehealth: Payer: Self-pay | Admitting: Orthopedic Surgery

## 2022-12-08 NOTE — Telephone Encounter (Signed)
Dr. Mort Sawyers pt - pt lvm requesting a brace.  [phone]

## 2022-12-08 NOTE — Telephone Encounter (Signed)
Spoke w/the patient, he is wanting a brace for his left knee.  Dr. Romeo Apple recently drew the fluid off per the patient.

## 2022-12-11 NOTE — Telephone Encounter (Signed)
Dr. Mort Sawyers patient - the patient has left another voicemail today requesting a lt knee brace.

## 2022-12-25 ENCOUNTER — Ambulatory Visit: Payer: Medicare HMO | Admitting: Orthopedic Surgery

## 2022-12-31 DIAGNOSIS — E559 Vitamin D deficiency, unspecified: Secondary | ICD-10-CM | POA: Insufficient documentation

## 2022-12-31 DIAGNOSIS — G4700 Insomnia, unspecified: Secondary | ICD-10-CM | POA: Insufficient documentation

## 2022-12-31 DIAGNOSIS — E876 Hypokalemia: Secondary | ICD-10-CM | POA: Insufficient documentation

## 2022-12-31 DIAGNOSIS — J302 Other seasonal allergic rhinitis: Secondary | ICD-10-CM | POA: Insufficient documentation

## 2022-12-31 DIAGNOSIS — L210 Seborrhea capitis: Secondary | ICD-10-CM | POA: Insufficient documentation

## 2022-12-31 DIAGNOSIS — R42 Dizziness and giddiness: Secondary | ICD-10-CM | POA: Insufficient documentation

## 2022-12-31 DIAGNOSIS — J45909 Unspecified asthma, uncomplicated: Secondary | ICD-10-CM | POA: Insufficient documentation

## 2022-12-31 DIAGNOSIS — L282 Other prurigo: Secondary | ICD-10-CM | POA: Insufficient documentation

## 2022-12-31 DIAGNOSIS — G8929 Other chronic pain: Secondary | ICD-10-CM | POA: Insufficient documentation

## 2023-01-20 DIAGNOSIS — R7303 Prediabetes: Secondary | ICD-10-CM | POA: Insufficient documentation

## 2023-01-21 DIAGNOSIS — L709 Acne, unspecified: Secondary | ICD-10-CM | POA: Insufficient documentation

## 2023-03-02 DIAGNOSIS — J0110 Acute frontal sinusitis, unspecified: Secondary | ICD-10-CM | POA: Insufficient documentation

## 2023-03-31 DIAGNOSIS — R011 Cardiac murmur, unspecified: Secondary | ICD-10-CM | POA: Insufficient documentation

## 2023-04-30 DIAGNOSIS — N289 Disorder of kidney and ureter, unspecified: Secondary | ICD-10-CM | POA: Insufficient documentation

## 2023-07-23 DIAGNOSIS — H401111 Primary open-angle glaucoma, right eye, mild stage: Secondary | ICD-10-CM | POA: Diagnosis not present

## 2023-07-23 DIAGNOSIS — M069 Rheumatoid arthritis, unspecified: Secondary | ICD-10-CM | POA: Diagnosis not present

## 2023-09-03 DIAGNOSIS — R059 Cough, unspecified: Secondary | ICD-10-CM | POA: Diagnosis not present

## 2023-09-03 DIAGNOSIS — J019 Acute sinusitis, unspecified: Secondary | ICD-10-CM | POA: Diagnosis not present

## 2023-09-03 DIAGNOSIS — R062 Wheezing: Secondary | ICD-10-CM | POA: Diagnosis not present

## 2023-09-03 DIAGNOSIS — R040 Epistaxis: Secondary | ICD-10-CM | POA: Diagnosis not present

## 2023-10-13 DIAGNOSIS — J019 Acute sinusitis, unspecified: Secondary | ICD-10-CM | POA: Diagnosis not present

## 2023-10-13 DIAGNOSIS — Z7951 Long term (current) use of inhaled steroids: Secondary | ICD-10-CM | POA: Diagnosis not present

## 2023-10-13 DIAGNOSIS — J45909 Unspecified asthma, uncomplicated: Secondary | ICD-10-CM | POA: Diagnosis not present

## 2023-10-23 DIAGNOSIS — R7303 Prediabetes: Secondary | ICD-10-CM | POA: Diagnosis not present

## 2023-10-23 DIAGNOSIS — Z125 Encounter for screening for malignant neoplasm of prostate: Secondary | ICD-10-CM | POA: Diagnosis not present

## 2023-10-23 DIAGNOSIS — E559 Vitamin D deficiency, unspecified: Secondary | ICD-10-CM | POA: Diagnosis not present

## 2023-10-23 DIAGNOSIS — R891 Abnormal level of hormones in specimens from other organs, systems and tissues: Secondary | ICD-10-CM | POA: Diagnosis not present

## 2023-10-23 DIAGNOSIS — E782 Mixed hyperlipidemia: Secondary | ICD-10-CM | POA: Diagnosis not present

## 2023-10-29 DIAGNOSIS — M1990 Unspecified osteoarthritis, unspecified site: Secondary | ICD-10-CM | POA: Diagnosis not present

## 2023-10-29 DIAGNOSIS — J45909 Unspecified asthma, uncomplicated: Secondary | ICD-10-CM | POA: Diagnosis not present

## 2023-10-29 DIAGNOSIS — M545 Low back pain, unspecified: Secondary | ICD-10-CM | POA: Diagnosis not present

## 2023-10-29 DIAGNOSIS — R42 Dizziness and giddiness: Secondary | ICD-10-CM | POA: Diagnosis not present

## 2023-10-29 DIAGNOSIS — I10 Essential (primary) hypertension: Secondary | ICD-10-CM | POA: Diagnosis not present

## 2023-10-29 DIAGNOSIS — K219 Gastro-esophageal reflux disease without esophagitis: Secondary | ICD-10-CM | POA: Diagnosis not present

## 2023-10-29 DIAGNOSIS — E559 Vitamin D deficiency, unspecified: Secondary | ICD-10-CM | POA: Diagnosis not present

## 2023-10-29 DIAGNOSIS — E782 Mixed hyperlipidemia: Secondary | ICD-10-CM | POA: Diagnosis not present

## 2023-10-29 DIAGNOSIS — G4700 Insomnia, unspecified: Secondary | ICD-10-CM | POA: Diagnosis not present

## 2023-11-09 ENCOUNTER — Ambulatory Visit (INDEPENDENT_AMBULATORY_CARE_PROVIDER_SITE_OTHER): Payer: Self-pay

## 2023-11-09 ENCOUNTER — Ambulatory Visit: Admitting: Orthopedic Surgery

## 2023-11-09 VITALS — BP 156/100 | HR 87 | Ht 71.0 in | Wt 274.0 lb

## 2023-11-09 DIAGNOSIS — M25572 Pain in left ankle and joints of left foot: Secondary | ICD-10-CM

## 2023-11-09 DIAGNOSIS — M76822 Posterior tibial tendinitis, left leg: Secondary | ICD-10-CM | POA: Diagnosis not present

## 2023-11-09 DIAGNOSIS — M6702 Short Achilles tendon (acquired), left ankle: Secondary | ICD-10-CM

## 2023-11-09 NOTE — Patient Instructions (Signed)
 Physical therapy has been ordered for you at [facility]. They should call you to schedule, [phone] is the phone number to call, if you want to call to schedule.

## 2023-11-09 NOTE — Progress Notes (Signed)
 Patient ID: Parker Rodriguez, male   DOB: 1961-02-24, 63 y.o.   MRN: [id number]  ASSESSMENT AND PLAN:  Encounter Diagnoses  Name Primary?   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  I think he is probably a 3 or late to he does not have ankle joint arthritis on x-ray  Recommend physical therapy Ice Epsom salt Supportive shoe Ankle brace lace up Return in 2 months if no improvement  MRI and referral to foot and ankle specialist for definitive management  Chief Complaint  Patient presents with   Ankle Pain    Left    63 year old male 1 month pain left ankle lateral side worse with weightbearing Seems to swell a lot Increased pain with weightbearing   Ankle Pain  Incident onset: 1 MONTH. There was no injury mechanism. The pain is present in the left ankle. The quality of the pain is described as aching and burning. The pain is moderate. The pain has been Worsening since onset. Pertinent negatives include no inability to bear weight, loss of motion, loss of sensation, muscle weakness, numbness or tingling. Associated symptoms comments: PAIN WITH WEIGHT BEARING . The symptoms are aggravated by weight bearing. Treatments tried: [facility] AND MUSCLE RELAXER.   Past Medical History:  Diagnosis Date   Allergic rhinitis    Anxiety    Arthritis    Asthma    Carpal tunnel syndrome    Depressive reaction    Hypercholesterolemia    Hypertension    benign essentia   Intervertebral disc disorder    Obesity    Paroxysmal vertigo    Peptic ulcer    remote past   BP (!) 156/100 Comment: didnt take meds today patient will monitor  Pulse 87   Ht 5\' 11"  (1.803 m)   Wt 274 lb (124.3 kg)   BMI 38.22 kg/m   Physical Exam Vitals and nursing note reviewed.  Constitutional:      Appearance: Normal appearance.  HENT:     Head:  Normocephalic and atraumatic.  Eyes:     General: No scleral icterus.       Right eye: No discharge.        Left eye: No discharge.     Extraocular Movements: Extraocular movements intact.     Conjunctiva/sclera: Conjunctivae normal.     Pupils: Pupils are equal, round, and reactive to light.  Cardiovascular:     Rate and Rhythm: Normal rate.     Pulses: Normal pulses.  Skin:    General: Skin is warm and dry.     Capillary Refill: Capillary refill takes less than 2 seconds.  Neurological:     General: No focal deficit present.     Mental Status: He is alert and oriented to person, place, and time.  Psychiatric:        Mood and Affect: Mood normal.        Behavior: Behavior normal.        Thought Content: Thought content normal.        Judgment: Judgment normal.    Left ankle.  The ankle is in pes planovalgus he has decreased subtalar motion.  Ankle motion is pretty good but his Achilles tendon is tight.  He is tender over the subtalar sinus Tarsi area.  He is also  tender over the medial side with swelling medially and laterally with no anterior posterior instability and he has posterior tibial tendon weakness with the foot plantarflexed and inverted He has some great toe nail deformity  DG Ankle Complete Left Result Date: 11/09/2023 X-rays left ankle Medial and lateral pain more lateral than medial We do notice the flatfoot there is no ankle arthritis soft tissue swelling along the medial lateral aspects Impression normal ankle joint with soft tissue swelling

## 2023-11-09 NOTE — Progress Notes (Signed)
  Intake history:  BP (!) 156/100 Comment: didnt take meds today patient will monitor  Pulse 87   Ht 5\' 11"  (1.803 m)   Wt 274 lb (124.3 kg)   BMI 38.22 kg/m  Body mass index is 38.22 kg/m.    WHAT ARE WE SEEING YOU FOR TODAY?   left ankle(s)  How long has this bothered you? (DOI?DOS?WS?)  1 month(s) ago  Anticoag.  No  Diabetes No  Heart disease No  Hypertension Yes  SMOKING HX No  Kidney disease     Latest Ref Rng & Units 11/23/2015    2:41 PM 06/13/2014    8:44 AM 03/11/2012   11:41 PM  CMP  Glucose 65 - 99 mg/dL 96  540  981   BUN 6 - 20 mg/dL 15  15  22    Creatinine 0.61 - 1.24 mg/dL 1.91  4.78  2.95   Sodium 135 - 145 mmol/L 140  134  134   Potassium 3.5 - 5.1 mmol/L 3.4  3.5  3.8   Chloride 101 - 111 mmol/L 99  92  95   CO2 19 - 32 mEq/L  30  30   Calcium 8.4 - 10.5 mg/dL  9.0  9.8   Total Protein 6.0 - 8.3 g/dL  7.7  7.7   Total Bilirubin 0.3 - 1.2 mg/dL  0.9  0.5   Alkaline Phos 39 - 117 U/L  111  243   AST 0 - 37 U/L  38  42   ALT 0 - 53 U/L  29  51      Any ALLERGIES ________ Allergies  Allergen Reactions   Latex Other (See Comments)    Smell causes congestion     _____________________________________   Treatment:  Have you taken:  Tylenol  No  Advil  No  Had PT No  Had injection No  Other  ________________hydrocodone and muscle relaxer _________

## 2023-11-24 DIAGNOSIS — Z0100 Encounter for examination of eyes and vision without abnormal findings: Secondary | ICD-10-CM | POA: Diagnosis not present

## 2023-11-24 DIAGNOSIS — H524 Presbyopia: Secondary | ICD-10-CM | POA: Diagnosis not present

## 2023-11-24 DIAGNOSIS — H401122 Primary open-angle glaucoma, left eye, moderate stage: Secondary | ICD-10-CM | POA: Diagnosis not present

## 2023-11-27 NOTE — Therapy (Unsigned)
 OUTPATIENT PHYSICAL THERAPY LOWER EXTREMITY EVALUATION   Patient Name: Parker Rodriguez MRN: [id number] DOB:10/29/1960, 63 y.o., male Today's Date: 11/27/2023  END OF SESSION:   Past Medical History:  Diagnosis Date   Allergic rhinitis    Anxiety    Arthritis    Asthma    Carpal tunnel syndrome    Depressive reaction    Hypercholesterolemia    Hypertension    benign essentia   Intervertebral disc disorder    Obesity    Paroxysmal vertigo    Peptic ulcer    remote past   Past Surgical History:  Procedure Laterality Date   COLONOSCOPY  09/11/2011   Procedure: COLONOSCOPY;  Surgeon: Suzette Espy, MD;  [facility];  Service: Endoscopy;  Laterality: N/A;  9:15   HAND SURGERY     left   Patient Active Problem List   Diagnosis Date Noted   Abnormal renal function 04/30/2023   Systolic murmur 03/31/2023   Acute frontal sinusitis 03/02/2023   Acne 01/21/2023   Prediabetes 01/20/2023   Seasonal allergies 12/31/2022   Asthma 12/31/2022   Chronic low back pain 12/31/2022   Hypokalemia 12/31/2022   Insomnia 12/31/2022   Pityriasis 12/31/2022   Pruritic rash 12/31/2022   Vertigo 12/31/2022   Vitamin D deficiency 12/31/2022   Rectal bleeding 08/24/2011   KNEE, ARTHRITIS, DEGEN./OSTEO 02/20/2010   Acute upper respiratory infection 04/02/2009   DYSPNEA ON EXERTION 11/29/2008   DEGENERATIVE DISC DISEASE, LUMBOSACRAL SPINE W/RADICULOPATHY 06/25/2007   FASTING HYPERGLYCEMIA 06/25/2007   Carpal tunnel syndrome 04/27/2007   DEPRESSION/ANXIETY 01/11/2007   HYPERLIPIDEMIA 07/04/2006   MORBID OBESITY 07/04/2006   Essential hypertension 07/04/2006   Hemorrhoids 07/04/2006   Allergic rhinitis 07/04/2006   GERD 07/04/2006   IMPOTENCE, ORGANIC ORIGIN 07/04/2006   Osteoarthritis 07/04/2006    PCP: Darliss Ek, MD  REFERRING PROVIDER: Darrin Emerald, MD  REFERRING DIAG: [phone] (ICD-10-CM) - Pain in left ankle and joints of left foot M67.02 (ICD-10-CM) -  Acquired tight Achilles tendon, left M76.822 (ICD-10-CM) - Insufficiency of left posterior tibial tendon  THERAPY DIAG:  No diagnosis found.  Rationale for Evaluation and Treatment: Rehabilitation  ONSET DATE: ***  SUBJECTIVE:   SUBJECTIVE STATEMENT: ***  PERTINENT HISTORY: *** PAIN:  Are you having pain? {OPRCPAIN:27236}  PRECAUTIONS: None  RED FLAGS: {PT Red Flags:29287}   WEIGHT BEARING RESTRICTIONS: No  FALLS:  Has patient fallen in last 6 months? {fallsyesno:27318}  LIVING ENVIRONMENT: Lives with: {OPRC lives with:25569::"lives with their family"} Lives in: {Lives in:25570} Stairs: {opstairs:27293} Has following equipment at home: {Assistive devices:23999}  OCCUPATION: ***  PLOF: {PLOF:24004}  PATIENT GOALS: ***  NEXT MD VISIT: ***  OBJECTIVE:  Note: Objective measures were completed at Evaluation unless otherwise noted.  DIAGNOSTIC FINDINGS:  X-rays left ankle   Medial and lateral pain more lateral than medial   We do notice the flatfoot there is no ankle arthritis soft tissue swelling along the medial lateral aspects   Impression normal ankle joint with soft tissue swelling    PATIENT SURVEYS:  {rehab surveys:24030}  COGNITION: Overall cognitive status: {cognition:24006}     SENSATION: {sensation:27233}  EDEMA:  {edema:24020}  MUSCLE LENGTH: Hamstrings: Right *** deg; Left *** deg Andy Bannister test: Right *** deg; Left *** deg  POSTURE: {posture:25561}  PALPATION: ***  LOWER EXTREMITY ROM:  {AROM/PROM:27142} ROM Right eval Left eval  Hip flexion    Hip extension    Hip abduction    Hip adduction    Hip internal rotation  Hip external rotation    Knee flexion    Knee extension    Ankle dorsiflexion    Ankle plantarflexion    Ankle inversion    Ankle eversion     (Blank rows = not tested)  LOWER EXTREMITY MMT:  MMT Right eval Left eval  Hip flexion    Hip extension    Hip abduction    Hip adduction    Hip  internal rotation    Hip external rotation    Knee flexion    Knee extension    Ankle dorsiflexion    Ankle plantarflexion    Ankle inversion    Ankle eversion     (Blank rows = not tested)  LOWER EXTREMITY SPECIAL TESTS:  {LEspecialtests:26242}  FUNCTIONAL TESTS:  {Functional tests:24029}  GAIT: Distance walked: *** Assistive device utilized: {Assistive devices:23999} Level of assistance: {Levels of assistance:24026} Comments: ***                                                                                                                                TREATMENT DATE:  11/30/23:  PT evaluation and HEP    PATIENT EDUCATION:  Education details: PT evaluation, objective findings, POC, Importance of HEP, Precautions, Clinic policies  Person educated: Patient Education method: Explanation and Demonstration Education comprehension: verbalized understanding and returned demonstration  HOME EXERCISE PROGRAM: ***  ASSESSMENT:  CLINICAL IMPRESSION: Patient is a 63 y.o. male who was seen today for physical therapy evaluation and treatment for M25.572 (ICD-10-CM) - Pain in left ankle and joints of left foot M67.02 (ICD-10-CM) - Acquired tight Achilles tendon, left M76.822 (ICD-10-CM) - Insufficiency of left posterior tibial tendon.   OBJECTIVE IMPAIRMENTS: {opptimpairments:25111}.   ACTIVITY LIMITATIONS: {activitylimitations:27494}  PARTICIPATION LIMITATIONS: {participationrestrictions:25113}  PERSONAL FACTORS: {Personal factors:25162} are also affecting patient's functional outcome.   REHAB POTENTIAL: {rehabpotential:25112}  CLINICAL DECISION MAKING: {clinical decision making:25114}  EVALUATION COMPLEXITY: {Evaluation complexity:25115}   GOALS: Goals reviewed with patient? No  SHORT TERM GOALS: Target date: 12/14/23 Patient will be independent with performance of HEP to demonstrate adequate self management of symptoms.  Baseline:  Goal status: INITIAL  2.    Patient will report at least a 25% improvement with function or pain overall since beginning PT. Baseline:  Goal status: INITIAL  LONG TERM GOALS: Target date: 01/11/24 Patient will improve LEFS score by 9 points to demonstrate improved perceived function while meeting MCID.  Baseline: Goal status: INITIAL 2.  Patient will improve ROM by at least *** degrees to show improved LE mobility for improve functional transfers and QOL.  Baseline:  Goal status: INITIAL 3.  Patient will score at least a  *** on LLE MMT to show increased LE strength and/or power and improve ambulation/gait mechanics.  Baseline:  Goal status: INITIAL   4.  Patient will increase gait distance to *** with least restrictive assistive device to demonstrate improved functional mobility walking household and community distances.  Baseline: Goal status:  INITIAL   PLAN:  PT FREQUENCY: 1-2x/week  PT DURATION: 6 weeks  PLANNED INTERVENTIONS: 97164- PT Re-evaluation, 97110-Therapeutic exercises, 97530- Therapeutic activity, 97112- Neuromuscular re-education, 97535- Self Care, 28315- Manual therapy, [phone]- Gait training, [phone]- Electrical stimulation (manual), Patient/Family education, Balance training, Stair training, Dry Needling, Joint mobilization, Spinal mobilization, and Moist heat  PLAN FOR NEXT SESSION: ***   9:46 AM, 11/27/23 Marysue Sola, PT, DPT [facility] Auth Request  Referring diagnosis code (ICD 10)? *** Treatment diagnosis codes (ICD 10)? (if different than referring diagnosis) *** What was this (referring dx) caused by? []  Surgery []  Fall []  Ongoing issue []  Arthritis []  Other: ____________  Laterality: []  Rt []  Lt []  Both  Deficits: []  Pain []  Stiffness []  Weakness []  Edema []  Balance Deficits []  Coordination []  Gait Disturbance []  ROM []  Other   Functional Tool Score: ***  CPT codes: See Planned Interventions listed in the Plan  section of the Evaluation.

## 2023-11-30 ENCOUNTER — Ambulatory Visit (HOSPITAL_COMMUNITY)

## 2023-12-07 NOTE — Therapy (Incomplete)
 OUTPATIENT PHYSICAL THERAPY LOWER EXTREMITY EVALUATION   Patient Name: Parker Rodriguez MRN: [id number] DOB:[birth year], 63 y.o., male Today's Date: 12/07/2023  END OF SESSION:   Past Medical History:  Diagnosis Date   Allergic rhinitis    Anxiety    Arthritis    Asthma    Carpal tunnel syndrome    Depressive reaction    Hypercholesterolemia    Hypertension    benign essentia   Intervertebral disc disorder    Obesity    Paroxysmal vertigo    Peptic ulcer    remote past   Past Surgical History:  Procedure Laterality Date   COLONOSCOPY  09/11/2011   Procedure: COLONOSCOPY;  Surgeon: Suzette Espy, MD;  [facility];  Service: Endoscopy;  Laterality: N/A;  9:15   HAND SURGERY     left   Patient Active Problem List   Diagnosis Date Noted   Abnormal renal function 04/30/2023   Systolic murmur 03/31/2023   Acute frontal sinusitis 03/02/2023   Acne 01/21/2023   Prediabetes 01/20/2023   Seasonal allergies 12/31/2022   Asthma 12/31/2022   Chronic low back pain 12/31/2022   Hypokalemia 12/31/2022   Insomnia 12/31/2022   Pityriasis 12/31/2022   Pruritic rash 12/31/2022   Vertigo 12/31/2022   Vitamin D deficiency 12/31/2022   Rectal bleeding 08/24/2011   KNEE, ARTHRITIS, DEGEN./OSTEO 02/20/2010   Acute upper respiratory infection 04/02/2009   DYSPNEA ON EXERTION 11/29/2008   DEGENERATIVE DISC DISEASE, LUMBOSACRAL SPINE W/RADICULOPATHY 06/25/2007   FASTING HYPERGLYCEMIA 06/25/2007   Carpal tunnel syndrome 04/27/2007   DEPRESSION/ANXIETY 01/11/2007   HYPERLIPIDEMIA 07/04/2006   MORBID OBESITY 07/04/2006   Essential hypertension 07/04/2006   Hemorrhoids 07/04/2006   Allergic rhinitis 07/04/2006   GERD 07/04/2006   IMPOTENCE, ORGANIC ORIGIN 07/04/2006   Osteoarthritis 07/04/2006    PCP: Darliss Ek, MD  REFERRING PROVIDER: Darrin Emerald, MD  REFERRING DIAG: [phone] (ICD-10-CM) - Pain in left ankle and joints of left foot M67.02 (ICD-10-CM) -  Acquired tight Achilles tendon, left M76.822 (ICD-10-CM) - Insufficiency of left posterior tibial tendon  THERAPY DIAG:  No diagnosis found.  Rationale for Evaluation and Treatment: Rehabilitation  ONSET DATE: ***  SUBJECTIVE:   SUBJECTIVE STATEMENT: ***  PERTINENT HISTORY: *** PAIN:  Are you having pain? {OPRCPAIN:27236}  PRECAUTIONS: {Therapy precautions:24002}  RED FLAGS: {PT Red Flags:29287}   WEIGHT BEARING RESTRICTIONS: {Yes ***/No:24003}  FALLS:  Has patient fallen in last 6 months? {fallsyesno:27318}  LIVING ENVIRONMENT: Lives with: {OPRC lives with:25569::"lives with their family"} Lives in: {Lives in:25570} Stairs: {opstairs:27293} Has following equipment at home: {Assistive devices:23999}  OCCUPATION: ***  PLOF: {PLOF:24004}  PATIENT GOALS: ***  NEXT MD VISIT: ***  OBJECTIVE:  Note: Objective measures were completed at Evaluation unless otherwise noted.  DIAGNOSTIC FINDINGS: ***  PATIENT SURVEYS:  LEFS ***  COGNITION: Overall cognitive status: {cognition:24006}     SENSATION: {sensation:27233}  EDEMA:  {edema:24020}  MUSCLE LENGTH: Hamstrings: Right *** deg; Left *** deg Andy Bannister test: Right *** deg; Left *** deg  POSTURE: {posture:25561}  PALPATION: ***  LOWER EXTREMITY ROM:  {AROM/PROM:27142} ROM Right eval Left eval  Hip flexion    Hip extension    Hip abduction    Hip adduction    Hip internal rotation    Hip external rotation    Knee flexion    Knee extension    Ankle dorsiflexion    Ankle plantarflexion    Ankle inversion    Ankle eversion     (Blank rows = not  tested)  LOWER EXTREMITY MMT:  MMT Right eval Left eval  Hip flexion    Hip extension    Hip abduction    Hip adduction    Hip internal rotation    Hip external rotation    Knee flexion    Knee extension    Ankle dorsiflexion    Ankle plantarflexion    Ankle inversion    Ankle eversion     (Blank rows = not tested)  LOWER EXTREMITY SPECIAL  TESTS:  {LEspecialtests:26242}  FUNCTIONAL TESTS:  {Functional tests:24029}  GAIT: Distance walked: *** Assistive device utilized: {Assistive devices:23999} Level of assistance: {Levels of assistance:24026} Comments: ***                                                                                                                                TREATMENT DATE: 12/08/23 physical therapy evaluation and HEP instruction    PATIENT EDUCATION:  Education details: Patient educated on exam findings, POC, scope of PT, HEP, and ***. Person educated: Patient Education method: Explanation, Demonstration, and Handouts Education comprehension: verbalized understanding, returned demonstration, verbal cues required, and tactile cues required  HOME EXERCISE PROGRAM: ***  ASSESSMENT:  CLINICAL IMPRESSION: Patient is a 63 y.o. male who was seen today for physical therapy evaluation and treatment for M25.572 (ICD-10-CM) - Pain in left ankle and joints of left foot M67.02 (ICD-10-CM) - Acquired tight Achilles tendon, left M76.822 (ICD-10-CM) - Insufficiency of left posterior tibial tendon.   OBJECTIVE IMPAIRMENTS: {opptimpairments:25111}.   ACTIVITY LIMITATIONS: {activitylimitations:27494}  PARTICIPATION LIMITATIONS: {participationrestrictions:25113}  PERSONAL FACTORS: {Personal factors:25162} are also affecting patient's functional outcome.   REHAB POTENTIAL: Good  CLINICAL DECISION MAKING: Evolving/moderate complexity  EVALUATION COMPLEXITY: Moderate   GOALS: Goals reviewed with patient? No  SHORT TERM GOALS: Target date: *** patient will be independent with initial HEP  Baseline: Goal status: INITIAL  2.  Patient will report 50% improvement overall  Baseline:  Goal status: INITIAL  3.  *** Baseline:  Goal status: INITIAL  4.  *** Baseline:  Goal status: INITIAL  5.  *** Baseline:  Goal status: INITIAL  6.  *** Baseline:  Goal status: INITIAL  LONG TERM GOALS:  Target date: ***  Patient will be independent in self management strategies to improve quality of life and functional outcomes.  Baseline:  Goal status: INITIAL  2.  Patient will report 75% improvement overall  Baseline:  Goal status: INITIAL  3.  *** Baseline:  Goal status: INITIAL  4.  *** Baseline:  Goal status: INITIAL  5.  *** Baseline:  Goal status: INITIAL  6.  *** Baseline:  Goal status: INITIAL   PLAN:  PT FREQUENCY: {rehab frequency:25116}  PT DURATION: {rehab duration:25117}  PLANNED INTERVENTIONS: 97164- PT Re-evaluation, 97110-Therapeutic exercises, 97530- Therapeutic activity, 97112- Neuromuscular re-education, 97535- Self Care, 21308- Manual therapy, [id number]- Gait training, [phone]- Orthotic Fit/training, [id number]- Canalith repositioning, [id number]- Aquatic Therapy, [phone]- Splinting, Patient/Family education, Balance training, Stair training, Taping,  Dry Needling, Joint mobilization, Joint manipulation, Spinal manipulation, Spinal mobilization, Scar mobilization, and DME instructions.   PLAN FOR NEXT SESSION: Review HEP and goals   2:19 PM, 12/08/23 Jeramiah Mccaughey Small Marlowe Lawes MPT [place] [facility] [place] [phone]

## 2023-12-08 ENCOUNTER — Ambulatory Visit (HOSPITAL_COMMUNITY)

## 2023-12-09 ENCOUNTER — Telehealth (HOSPITAL_COMMUNITY): Payer: Self-pay

## 2023-12-09 NOTE — Telephone Encounter (Signed)
 Patient states his back and knee are really bothering him and he forgot about his appointment yesterday.  States he would like to call back and reschedule when he can make it in.   8:51 AM, 12/09/23 Kerston Landeck Small Mavis Fichera MPT [facility] [place] [phone]

## 2024-01-04 ENCOUNTER — Ambulatory Visit: Admitting: Orthopedic Surgery

## 2024-02-26 ENCOUNTER — Encounter: Payer: Self-pay | Admitting: Orthopedic Surgery

## 2024-02-26 ENCOUNTER — Ambulatory Visit: Admitting: Orthopedic Surgery

## 2024-02-26 DIAGNOSIS — M5441 Lumbago with sciatica, right side: Secondary | ICD-10-CM | POA: Diagnosis not present

## 2024-02-26 DIAGNOSIS — M76822 Posterior tibial tendinitis, left leg: Secondary | ICD-10-CM | POA: Diagnosis not present

## 2024-02-26 MED ORDER — METHOCARBAMOL 500 MG PO TABS: 500.0000 mg | ORAL_TABLET | Freq: Three times a day (TID) | ORAL | 1 refills | Status: AC

## 2024-02-26 NOTE — Progress Notes (Signed)
   Chief Complaint  Patient presents with   Ankle Pain    Left     Encounter Diagnoses  Name Primary?   Acute right-sided low back pain with right-sided sciatica Yes   Insufficiency of left posterior tibial tendon     Having flare up of right back pain into right thigh  Long history of back pain   Last visit date 11/09/2023     Encounter Diagnoses  Name Primary?   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  I think he is probably a 3 or late to he does not have ankle joint arthritis on x-ray   Recommend physical therapy Ice Epsom salt Supportive shoe Ankle brace lace up Return in 2 months if no improvement   MRI and referral to foot and ankle specialist for definitive management.  Today February 26, 2024 His foot and ankle seem to have gotten better although all he did was elevate and take it easy in terms of his activities he did not go to therapy he did not get a ankle brace that we ordered (double upright ankle orthosis or lace up ankle orthosis)  As far as his back goes he was scheduled for an MRI of his back and he never got it I will put him on a muscle relaxer Advise him to rest for a few days If he does not improve have him see Dr. Georgina

## 2024-02-26 NOTE — Progress Notes (Signed)
   There were no vitals taken for this visit.  There is no height or weight on file to calculate BMI.  Chief Complaint  Patient presents with   Ankle Pain    Left     No diagnosis found.  DOI/DOS/ Date: ongoing   Unchanged       Having flare up of right back pain into right thigh  Long history of back pain

## 2024-03-07 ENCOUNTER — Telehealth: Payer: Self-pay | Admitting: Orthopedic Surgery

## 2024-03-07 DIAGNOSIS — M5441 Lumbago with sciatica, right side: Secondary | ICD-10-CM

## 2024-03-07 NOTE — Telephone Encounter (Signed)
 I Sent order called him to advise.

## 2024-03-07 NOTE — Telephone Encounter (Signed)
 Dr. Areatha pt - pt lvm wanting to know if he can go to PT across from [facility].  [phone]

## 2024-03-18 ENCOUNTER — Ambulatory Visit: Admitting: Orthopedic Surgery

## 2024-03-31 ENCOUNTER — Encounter (HOSPITAL_COMMUNITY): Payer: Self-pay

## 2024-03-31 ENCOUNTER — Other Ambulatory Visit: Payer: Self-pay

## 2024-03-31 ENCOUNTER — Ambulatory Visit (HOSPITAL_COMMUNITY): Attending: Orthopedic Surgery

## 2024-03-31 DIAGNOSIS — M5459 Other low back pain: Secondary | ICD-10-CM | POA: Diagnosis present

## 2024-03-31 DIAGNOSIS — Z7409 Other reduced mobility: Secondary | ICD-10-CM | POA: Insufficient documentation

## 2024-03-31 DIAGNOSIS — M5441 Lumbago with sciatica, right side: Secondary | ICD-10-CM | POA: Diagnosis not present

## 2024-03-31 DIAGNOSIS — M5386 Other specified dorsopathies, lumbar region: Secondary | ICD-10-CM | POA: Diagnosis present

## 2024-03-31 NOTE — Therapy (Addendum)
 OUTPATIENT PHYSICAL THERAPY THORACOLUMBAR EVALUATION PHYSICAL THERAPY DISCHARGE SUMMARY  Visits from Start of Care: 0  Current functional level related to goals / functional outcomes: See below   Remaining deficits: See below   Education / Equipment: See below   Patient agrees to discharge. Patient goals were not met. Patient is being discharged due to not returning since the last visit. Patient called and requested us  to cancel all appointments as he has been dealing with too much pain. Pt to be discharged this date, 04/22/24.   Patient Name: Parker Rodriguez MRN: [id number] DOB:28-Nov-1960, 63 y.o., male Today's Date: 03/31/2024  END OF SESSION:  PT End of Session - 03/31/24 1727     Visit Number 1    Date for PT Re-Evaluation 05/12/24    Authorization Type [employer] CHO[employer] PPO    Authorization Time Period seeking auth    Authorization - Visit Number 0    Progress Note Due on Visit 10    PT Start Time 1433    PT Stop Time 1515    PT Time Calculation (min) 42 min    Activity Tolerance Patient limited by pain    Behavior During Therapy WFL for tasks assessed/performed          Past Medical History:  Diagnosis Date   Allergic rhinitis    Anxiety    Arthritis    Asthma    Carpal tunnel syndrome    Depressive reaction    Hypercholesterolemia    Hypertension    benign essentia   Intervertebral disc disorder    Obesity    Paroxysmal vertigo    Peptic ulcer    remote past   Past Surgical History:  Procedure Laterality Date   COLONOSCOPY  09/11/2011   Procedure: COLONOSCOPY;  Surgeon: Lamar CHRISTELLA Hollingshead, MD;  [facility];  Service: Endoscopy;  Laterality: N/A;  9:15   HAND SURGERY     left   Patient Active Problem List   Diagnosis Date Noted   Abnormal renal function 04/30/2023   Systolic murmur 03/31/2023   Acute frontal sinusitis 03/02/2023   Acne 01/21/2023   Prediabetes 01/20/2023   Seasonal allergies 12/31/2022   Asthma 12/31/2022    Chronic low back pain 12/31/2022   Hypokalemia 12/31/2022   Insomnia 12/31/2022   Pityriasis 12/31/2022   Pruritic rash 12/31/2022   Vertigo 12/31/2022   Vitamin D deficiency 12/31/2022   Rectal bleeding 08/24/2011   KNEE, ARTHRITIS, DEGEN./OSTEO 02/20/2010   Acute upper respiratory infection 04/02/2009   DYSPNEA ON EXERTION 11/29/2008   DEGENERATIVE DISC DISEASE, LUMBOSACRAL SPINE W/RADICULOPATHY 06/25/2007   FASTING HYPERGLYCEMIA 06/25/2007   Carpal tunnel syndrome 04/27/2007   DEPRESSION/ANXIETY 01/11/2007   HYPERLIPIDEMIA 07/04/2006   MORBID OBESITY 07/04/2006   Essential hypertension 07/04/2006   Hemorrhoids 07/04/2006   Allergic rhinitis 07/04/2006   GERD 07/04/2006   IMPOTENCE, ORGANIC ORIGIN 07/04/2006   Osteoarthritis 07/04/2006    PCP: Nemiah Kemps, MD   REFERRING PROVIDER: Margrette Taft BRAVO, MD  REFERRING DIAG: M54.41 (ICD-10-CM) - Acute right-sided low back pain with right-sided sciatica  Rationale for Evaluation and Treatment: Rehabilitation  THERAPY DIAG:  Other low back pain - Plan: PT plan of care cert/re-cert  Impaired functional mobility and activity tolerance - Plan: PT plan of care cert/re-cert  Decreased ROM of lumbar spine - Plan: PT plan of care cert/re-cert  ONSET DATE: 6th week ago  SUBJECTIVE:  SUBJECTIVE STATEMENT: Pt states he hurt the back over 10 years ago. Pt states he had to keep a refrigerator from falling on top of older gentleman and felt back pop. Pt states stepped off the porch and back went out. Pt states he is on a new muscle relaxer which has helped a little bit but certain movements still bother him. Pt states pain in back will cause RLE to give out.   PERTINENT HISTORY:  None reported Left knee needs to be replaced, been real bad about 2  years PAIN:  Are you having pain? Yes: NPRS scale: 7/10 Pain location: Right low back, right anterior thigh Pain description: shooting, burns, aches real bad Aggravating factors: standing in one place too long, sitting down sideways Relieving factors: raise feet up, massage roller  PRECAUTIONS: None  RED FLAGS: None   WEIGHT BEARING RESTRICTIONS: No  FALLS:  Has patient fallen in last 6 months? 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  DIAGNOSTIC FINDINGS:  See chart  PATIENT SURVEYS:  Modified Oswestry: 35 / 50 = 70.0 %  COGNITION: Overall cognitive status: Within functional limits for tasks assessed     SENSATION: Light touch: Impaired RLE tingling off and on   PALPATION: Pt demonstrates increased tenderness to right sided lumbar paraspinals and tenderness to corresponding spinous processes, level L3-L5.   LUMBAR ROM:   AROM eval  Flexion 35, worse pain  Extension 0, pain  Right lateral flexion 15, worse pain  Left lateral flexion 10, no as bad  Right rotation 50 % available, pain  Left rotation 50 % available, pain   (Blank rows = not tested)  LOWER EXTREMITY ROM:     Active  Right eval Left eval  Hip flexion    Hip extension    Hip abduction    Hip adduction    Hip internal rotation    Hip external rotation    Knee flexion    Knee extension    Ankle dorsiflexion    Ankle plantarflexion    Ankle inversion    Ankle eversion     (Blank rows = not tested)  LOWER EXTREMITY MMT:    MMT Right eval  Left eval  Hip flexion    Hip extension 2+, pain 4  Hip abduction    Hip adduction    Hip internal rotation    Hip external rotation    Knee flexion    Knee extension    Ankle dorsiflexion    Ankle plantarflexion    Ankle inversion    Ankle eversion     (Blank rows = not tested)  LUMBAR SPECIAL TESTS:  Straight leg raise test: Positive, right  FUNCTIONAL TESTS:  5 times sit to stand: TBA 2 minute walk test: TBA  GAIT: Distance walked: 80 feet to and from treatment area Assistive device utilized: None Level of assistance: Complete Independence Comments: Pt demonstrates decreased gait speed, antalgic gait pattern and decreased stride length.  TREATMENT DATE:  03/31/2024   Evaluation: -ROM measured, Strength assessed, HEP prescribed, pt educated on prognosis, findings, and importance of HEP compliance if  given.                                                                                                                                     PATIENT EDUCATION:  Education details: Pt was educated on findings of PT evaluation, prognosis, frequency of therapy visits and rationale, attendance policy, and HEP if given.   Person educated: Patient Education method: Explanation, Verbal cues, and Handouts Education comprehension: verbalized understanding, verbal cues required, and needs further education  HOME EXERCISE PROGRAM: Access Code: [email or web address] URL: [email or web address] Date: 03/31/2024 Prepared by: Lang Ada  Exercises - Supine Lower Trunk Rotation  - 1 x daily - 7 x weekly - 3 sets - 10 reps - Supine Single Knee to Chest Stretch  - 1 x daily - 7 x weekly - 3 sets - 10 reps - Supine Posterior Pelvic Tilt  - 1 x daily - 7 x weekly - 3 sets - 10 reps  ASSESSMENT:  CLINICAL IMPRESSION: Patient is a 63 y.o. male who was seen today for physical therapy evaluation and treatment for M54.41 (ICD-10-CM) - Acute right-sided low back pain with right-sided  sciatica.  Patient demonstrates significant low back pain on right side, decreased LE/core strength, decreased ROM of lumbar spine, and impaired functional mobility. Patient also demonstrates difficulty with ambulation during today's session with decreased stride length and velocity noted. Patient also demonstrates increased pain rating with evaluation with AROM and increased tenderness to palpation of lumbar spine and right sided paraspinals musculature. Patient requires education on importance of pain free ROM, giving time for healing before pushing it too hard. Patient would benefit from skilled physical therapy for decreased low back pain, HEP progression/prescription, increased endurance with ambulation, increased LE/core strength, and improved functional mobiliy for improved gait quality, return to higher level of function with ADLs, and progress towards therapy goals.    OBJECTIVE IMPAIRMENTS: Abnormal gait, decreased activity tolerance, decreased endurance, decreased mobility, difficulty walking, decreased ROM, decreased strength, hypomobility, impaired flexibility, impaired sensation, and pain.   ACTIVITY LIMITATIONS: carrying, lifting, bending, standing, squatting, sleeping, stairs, transfers, and bed mobility  PARTICIPATION LIMITATIONS: meal prep, cleaning, driving, shopping, community activity, and yard work  PERSONAL FACTORS: Age, Fitness, Past/current experiences, and Time since onset of injury/illness/exacerbation are also affecting patient's functional outcome.   REHAB POTENTIAL: Fair chronic in nature  CLINICAL DECISION MAKING: Stable/uncomplicated  EVALUATION COMPLEXITY: Low   GOALS: Goals reviewed with patient? No  SHORT TERM GOALS: Target date: 04/21/24  Pt will be independent with HEP in order to demonstrate participation in Physical Therapy POC.  Baseline: Goal status: INITIAL  2.  Pt will report 5/10 pain with mobility in order to demonstrate improved pain with  ADLs.  Baseline:  Goal status: INITIAL  LONG TERM GOALS: Target date: 05/12/24  Pt will improve 5TSTS by at least 2.3 seconds in order to demonstrate improved functional strength to return to desired activities.  Baseline: see  objective.  Goal status: INITIAL  2.  Pt will improve 2 MWT by 40 feet in order to demonstrate improved functional ambulatory capacity in community setting.  Baseline: see objective.  Goal status: INITIAL  3.  Pt will improve Modified Oswestry score by at least 6 points in order to demonstrate improved functionality with ADL. Baseline: see objective.  Goal status: INITIAL  4.  Pt will report 3/10 pain with mobility in order to demonstrate reduced pain with ADLs lasting greater than 30 minutes.  Baseline: see objective.  Goal status: INITIAL   PLAN:  PT FREQUENCY: 1-2x/week  PT DURATION: 6 weeks  PLANNED INTERVENTIONS: 97110-Therapeutic exercises, 97530- Therapeutic activity, [id number]- Neuromuscular re-education, 97535- Self Care, 02859- Manual therapy, [phone]- Gait training, [phone]- Electrical stimulation (unattended), [phone]- Electrical stimulation (manual), Patient/Family education, Balance training, Stair training, Spinal mobilization, DME instructions, Cryotherapy, and Moist heat.  PLAN FOR NEXT SESSION: , 5TSTS, complete LE strength assessment, progress core and proximal LE strengthening (pain free if possible, in flared condition at eval), progress HEP as necessary   Lang Ada, PT, DPT [facility] Office: [phone] 5:47 PM, 03/31/24  [employer] Auth Request Treatment Start Date: 04/01/24  Referring diagnosis code (ICD 10)? M54.41 (ICD-10-CM) - Acute right-sided low back pain with right-sided sciatica Treatment diagnosis codes (ICD 10)? (if different than referring diagnosis)  M54.59; Z74.09; M53.86 What was this (referring dx) caused by? []  Surgery []  Fall [x]  Ongoing issue []  Arthritis []  Other:  ____________  Laterality: [x]  Rt []  Lt []  Both  Deficits: [x]  Pain [x]  Stiffness [x]  Weakness []  Edema []  Balance Deficits []  Coordination [x]  Gait Disturbance [x]  ROM []  Other   Functional Tool Score: Modified Oswestry: 35 / 50 = 70.0 %  CPT codes: See Planned Interventions listed in the Plan section of the Evaluation.

## 2024-04-04 ENCOUNTER — Encounter (HOSPITAL_COMMUNITY)

## 2024-04-04 NOTE — Therapy (Incomplete)
 OUTPATIENT PHYSICAL THERAPY THORACOLUMBAR TREATMENT   Patient Name: Parker Rodriguez MRN: [id number] DOB:July 12, 1961, 63 y.o., male Today's Date: 04/04/2024  END OF SESSION:    Past Medical History:  Diagnosis Date   Allergic rhinitis    Anxiety    Arthritis    Asthma    Carpal tunnel syndrome    Depressive reaction    Hypercholesterolemia    Hypertension    benign essentia   Intervertebral disc disorder    Obesity    Paroxysmal vertigo    Peptic ulcer    remote past   Past Surgical History:  Procedure Laterality Date   COLONOSCOPY  09/11/2011   Procedure: COLONOSCOPY;  Surgeon: Lamar CHRISTELLA Hollingshead, MD;  Location: [facility];  Service: Endoscopy;  Laterality: N/A;  9:15   HAND SURGERY     left   Patient Active Problem List   Diagnosis Date Noted   Abnormal renal function 04/30/2023   Systolic murmur 03/31/2023   Acute frontal sinusitis 03/02/2023   Acne 01/21/2023   Prediabetes 01/20/2023   Seasonal allergies 12/31/2022   Asthma 12/31/2022   Chronic low back pain 12/31/2022   Hypokalemia 12/31/2022   Insomnia 12/31/2022   Pityriasis 12/31/2022   Pruritic rash 12/31/2022   Vertigo 12/31/2022   Vitamin D deficiency 12/31/2022   Rectal bleeding 08/24/2011   KNEE, ARTHRITIS, DEGEN./OSTEO 02/20/2010   Acute upper respiratory infection 04/02/2009   DYSPNEA ON EXERTION 11/29/2008   DEGENERATIVE DISC DISEASE, LUMBOSACRAL SPINE W/RADICULOPATHY 06/25/2007   FASTING HYPERGLYCEMIA 06/25/2007   Carpal tunnel syndrome 04/27/2007   DEPRESSION/ANXIETY 01/11/2007   HYPERLIPIDEMIA 07/04/2006   MORBID OBESITY 07/04/2006   Essential hypertension 07/04/2006   Hemorrhoids 07/04/2006   Allergic rhinitis 07/04/2006   GERD 07/04/2006   IMPOTENCE, ORGANIC ORIGIN 07/04/2006   Osteoarthritis 07/04/2006    PCP: Nemiah Kemps, MD   REFERRING PROVIDER: Margrette Taft BRAVO, MD  REFERRING DIAG: M54.41 (ICD-10-CM) - Acute right-sided low back pain with right-sided  sciatica  Rationale for Evaluation and Treatment: Rehabilitation  THERAPY DIAG:  No diagnosis found.  ONSET DATE: 6th week ago  SUBJECTIVE:                                                                                                                                                                                           SUBJECTIVE STATEMENT: Daily: ***  (Initial) Pt states he hurt the back over 10 years ago. Pt states he had to keep a refrigerator from falling on top of older gentleman and felt back pop. Pt states stepped off the porch and back went out. Pt states he is on a new muscle relaxer  which has helped a little bit but certain movements still bother him. Pt states pain in back will cause RLE to give out.   PERTINENT HISTORY:  None reported Left knee needs to be replaced, been real bad about 2 years PAIN:  Are you having pain? Yes: NPRS scale: 7/10 Pain location: Right low back, right anterior thigh Pain description: shooting, burns, aches real bad Aggravating factors: standing in one place too long, sitting down sideways Relieving factors: raise feet up, massage roller  PRECAUTIONS: None  RED FLAGS: None   WEIGHT BEARING RESTRICTIONS: No  FALLS:  Has patient fallen in last 6 months? 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  DIAGNOSTIC FINDINGS:  See chart  PATIENT SURVEYS:  Modified Oswestry: 35 / 50 = 70.0 %  COGNITION: Overall cognitive status: Within functional limits for tasks  assessed     SENSATION: Light touch: Impaired RLE tingling off and on   PALPATION: Pt demonstrates increased tenderness to right sided lumbar paraspinals and tenderness to corresponding spinous processes, level L3-L5.   LUMBAR ROM:   AROM eval  Flexion 35, worse pain  Extension 0, pain  Right lateral flexion 15, worse pain  Left lateral flexion 10, no as bad  Right rotation 50 % available, pain  Left rotation 50 % available, pain   (Blank rows = not tested)  LOWER EXTREMITY ROM:     Active  Right eval Left eval  Hip flexion    Hip extension    Hip abduction    Hip adduction    Hip internal rotation    Hip external rotation    Knee flexion    Knee extension    Ankle dorsiflexion    Ankle plantarflexion    Ankle inversion    Ankle eversion     (Blank rows = not tested)  LOWER EXTREMITY MMT:    MMT Right eval Left eval  Hip flexion    Hip extension 2+, pain 4  Hip abduction    Hip adduction    Hip internal rotation    Hip external rotation    Knee flexion    Knee extension    Ankle dorsiflexion    Ankle plantarflexion    Ankle inversion    Ankle eversion     (Blank rows = not tested)  LUMBAR SPECIAL TESTS:  Straight leg raise test: Positive, right  FUNCTIONAL TESTS:  5 times sit to stand: TBA 2 minute walk test: TBA  GAIT: Distance walked: 80 feet to and from treatment area Assistive device utilized: None Level of assistance: Complete Independence Comments: Pt demonstrates decreased gait speed, antalgic gait pattern and decreased stride length.  TREATMENT DATE:  04/04/24 ***  03/31/2024  Evaluation: -ROM measured, Strength assessed, HEP prescribed, pt educated on prognosis, findings, and importance of HEP compliance if given.   PATIENT EDUCATION:  Education details: Pt was educated on findings of PT evaluation, prognosis, frequency of therapy visits and rationale, attendance policy, and HEP if given.   Person educated: Patient Education  method: Explanation, Verbal cues, and Handouts Education comprehension: verbalized understanding, verbal cues required,  and needs further education  HOME EXERCISE PROGRAM: Access Code: [email or web address] URL: [email or web address] Date: 03/31/2024 Prepared by: Lang Ada  Exercises - Supine Lower Trunk Rotation  - 1 x daily - 7 x weekly - 3 sets - 10 reps - Supine Single Knee to Chest Stretch  - 1 x daily - 7 x weekly - 3 sets - 10 reps - Supine Posterior Pelvic Tilt  - 1 x daily - 7 x weekly - 3 sets - 10 reps  ASSESSMENT:  CLINICAL IMPRESSION:   (Initial) Patient is a 63 y.o. male who was seen today for physical therapy evaluation and treatment for M54.41 (ICD-10-CM) - Acute right-sided low back pain with right-sided sciatica.  Patient demonstrates significant low back pain on right side, decreased LE/core strength, decreased ROM of lumbar spine, and impaired functional mobility. Patient also demonstrates difficulty with ambulation during today's session with decreased stride length and velocity noted. Patient also demonstrates increased pain rating with evaluation with AROM and increased tenderness to palpation of lumbar spine and right sided paraspinals musculature. Patient requires education on importance of pain free ROM, giving time for healing before pushing it too hard. Patient would benefit from skilled physical therapy for decreased low back pain, HEP progression/prescription, increased endurance with ambulation, increased LE/core strength, and improved functional mobiliy for improved gait quality, return to higher level of function with ADLs, and progress towards therapy goals.    OBJECTIVE IMPAIRMENTS: Abnormal gait, decreased activity tolerance, decreased endurance, decreased mobility, difficulty walking, decreased ROM, decreased strength, hypomobility, impaired flexibility, impaired sensation, and pain.   ACTIVITY LIMITATIONS: carrying, lifting, bending, standing,  squatting, sleeping, stairs, transfers, and bed mobility  PARTICIPATION LIMITATIONS: meal prep, cleaning, driving, shopping, community activity, and yard work  PERSONAL FACTORS: Age, Fitness, Past/current experiences, and Time since onset of injury/illness/exacerbation are also affecting patient's functional outcome.   REHAB POTENTIAL: Fair chronic in nature  CLINICAL DECISION MAKING: Stable/uncomplicated  EVALUATION COMPLEXITY: Low   GOALS: Goals reviewed with patient? No  SHORT TERM GOALS: Target date: 04/21/24  Pt will be independent with HEP in order to demonstrate participation in Physical Therapy POC.  Baseline: Goal status: INITIAL  2.  Pt will report 5/10 pain with mobility in order to demonstrate improved pain with ADLs.  Baseline:  Goal status: INITIAL  LONG TERM GOALS: Target date: 05/12/24  Pt will improve 5TSTS by at least 2.3 seconds in order to demonstrate improved functional strength to return to desired activities.  Baseline: see objective.  Goal status: INITIAL  2.  Pt will improve 2 MWT by 40 feet in order to demonstrate improved functional ambulatory capacity in community setting.  Baseline: see objective.  Goal status: INITIAL  3.  Pt will improve Modified Oswestry score by at least 6 points in order to demonstrate improved functionality with ADL. Baseline: see objective.  Goal status: INITIAL  4.  Pt will report 3/10 pain with mobility in order to demonstrate reduced pain with ADLs lasting greater than 30 minutes.  Baseline: see objective.  Goal status: INITIAL   PLAN:  PT FREQUENCY: 1-2x/week  PT DURATION: 6 weeks  PLANNED INTERVENTIONS: 97110-Therapeutic exercises, 97530- Therapeutic activity, [id number]- Neuromuscular re-education, 97535- Self Care, 02859- Manual therapy, [phone]- Gait training, [phone]- Electrical stimulation (unattended), [phone]- Electrical stimulation (manual), Patient/Family education, Balance training, Stair training, Spinal  mobilization, DME instructions, Cryotherapy, and Moist heat.  PLAN FOR NEXT SESSION: , 5TSTS, complete LE strength assessment, progress core and proximal LE strengthening (pain free if possible, in flared condition at eval),  progress HEP as necessary  Lamarr LITTIE Citrin PT, DPT [facility] [place] [phone] office  8:38 AM, 04/04/24

## 2024-04-07 ENCOUNTER — Encounter (HOSPITAL_COMMUNITY)

## 2024-04-20 ENCOUNTER — Encounter (HOSPITAL_COMMUNITY)

## 2024-04-20 ENCOUNTER — Telehealth (HOSPITAL_COMMUNITY): Payer: Self-pay

## 2024-04-20 NOTE — Telephone Encounter (Signed)
 No show #1, called with no answer and voice mail has not been set up, unable to leave message concening missed apt today.   Augustin Mclean, LPTA/CLT; [facility] [phone]

## 2024-04-22 ENCOUNTER — Encounter (HOSPITAL_COMMUNITY)

## 2024-04-25 ENCOUNTER — Encounter (HOSPITAL_COMMUNITY)

## 2024-04-28 ENCOUNTER — Ambulatory Visit: Admitting: Orthopedic Surgery

## 2024-04-29 ENCOUNTER — Encounter (HOSPITAL_COMMUNITY)

## 2024-05-02 ENCOUNTER — Encounter (HOSPITAL_COMMUNITY): Admitting: Physical Therapy

## 2024-05-02 DIAGNOSIS — H401111 Primary open-angle glaucoma, right eye, mild stage: Secondary | ICD-10-CM | POA: Diagnosis not present

## 2024-05-05 ENCOUNTER — Encounter (HOSPITAL_COMMUNITY)

## 2024-05-09 ENCOUNTER — Encounter (HOSPITAL_COMMUNITY)

## 2024-05-12 ENCOUNTER — Encounter (HOSPITAL_COMMUNITY)

## 2024-05-17 ENCOUNTER — Encounter (HOSPITAL_COMMUNITY)

## 2024-05-24 ENCOUNTER — Encounter (HOSPITAL_COMMUNITY)

## 2024-05-31 ENCOUNTER — Encounter (HOSPITAL_COMMUNITY)

## 2024-06-21 DIAGNOSIS — E782 Mixed hyperlipidemia: Secondary | ICD-10-CM | POA: Diagnosis not present

## 2024-06-28 DIAGNOSIS — Z0000 Encounter for general adult medical examination without abnormal findings: Secondary | ICD-10-CM | POA: Diagnosis not present

## 2024-06-28 DIAGNOSIS — Z23 Encounter for immunization: Secondary | ICD-10-CM | POA: Diagnosis not present

## 2024-06-28 DIAGNOSIS — Z0001 Encounter for general adult medical examination with abnormal findings: Secondary | ICD-10-CM | POA: Diagnosis not present

## 2024-06-28 DIAGNOSIS — E782 Mixed hyperlipidemia: Secondary | ICD-10-CM | POA: Diagnosis not present

## 2024-06-28 DIAGNOSIS — I10 Essential (primary) hypertension: Secondary | ICD-10-CM | POA: Diagnosis not present

## 2024-06-28 DIAGNOSIS — M1990 Unspecified osteoarthritis, unspecified site: Secondary | ICD-10-CM | POA: Diagnosis not present

## 2024-06-28 DIAGNOSIS — R944 Abnormal results of kidney function studies: Secondary | ICD-10-CM | POA: Diagnosis not present

## 2024-06-28 DIAGNOSIS — R7303 Prediabetes: Secondary | ICD-10-CM | POA: Diagnosis not present

## 2024-06-28 DIAGNOSIS — E876 Hypokalemia: Secondary | ICD-10-CM | POA: Diagnosis not present

## 2024-07-06 ENCOUNTER — Encounter (INDEPENDENT_AMBULATORY_CARE_PROVIDER_SITE_OTHER): Payer: Self-pay | Admitting: *Deleted
